# Patient Record
Sex: Female | Born: 1970 | Race: Black or African American | Hispanic: No | Marital: Single | Smoking: Never smoker
Health system: Southern US, Community
[De-identification: ages and names within clinical notes are randomized; demographics above are authoritative.]

## PROBLEM LIST (undated history)

## (undated) DIAGNOSIS — E785 Hyperlipidemia, unspecified: Secondary | ICD-10-CM

## (undated) DIAGNOSIS — R42 Dizziness and giddiness: Secondary | ICD-10-CM

## (undated) DIAGNOSIS — D649 Anemia, unspecified: Secondary | ICD-10-CM

## (undated) DIAGNOSIS — F32A Depression, unspecified: Secondary | ICD-10-CM

## (undated) DIAGNOSIS — M779 Enthesopathy, unspecified: Secondary | ICD-10-CM

## (undated) DIAGNOSIS — N840 Polyp of corpus uteri: Secondary | ICD-10-CM

## (undated) DIAGNOSIS — R011 Cardiac murmur, unspecified: Secondary | ICD-10-CM

## (undated) DIAGNOSIS — K5090 Crohn's disease, unspecified, without complications: Secondary | ICD-10-CM

## (undated) DIAGNOSIS — N943 Premenstrual tension syndrome: Secondary | ICD-10-CM

## (undated) DIAGNOSIS — S86899A Other injury of other muscle(s) and tendon(s) at lower leg level, unspecified leg, initial encounter: Secondary | ICD-10-CM

## (undated) DIAGNOSIS — F419 Anxiety disorder, unspecified: Secondary | ICD-10-CM

## (undated) DIAGNOSIS — I340 Nonrheumatic mitral (valve) insufficiency: Secondary | ICD-10-CM

## (undated) DIAGNOSIS — K219 Gastro-esophageal reflux disease without esophagitis: Secondary | ICD-10-CM

## (undated) DIAGNOSIS — F329 Major depressive disorder, single episode, unspecified: Secondary | ICD-10-CM

## (undated) DIAGNOSIS — M5136 Other intervertebral disc degeneration, lumbar region: Secondary | ICD-10-CM

## (undated) DIAGNOSIS — L309 Dermatitis, unspecified: Secondary | ICD-10-CM

## (undated) DIAGNOSIS — M51369 Other intervertebral disc degeneration, lumbar region without mention of lumbar back pain or lower extremity pain: Secondary | ICD-10-CM

## (undated) DIAGNOSIS — T7840XA Allergy, unspecified, initial encounter: Secondary | ICD-10-CM

## (undated) HISTORY — DX: Dizziness and giddiness: R42

## (undated) HISTORY — DX: Other injury of other muscle(s) and tendon(s) at lower leg level, unspecified leg, initial encounter: S86.899A

## (undated) HISTORY — PX: COLONOSCOPY: SHX174

## (undated) HISTORY — PX: TONSILLECTOMY: SUR1361

## (undated) HISTORY — DX: Hyperlipidemia, unspecified: E78.5

## (undated) HISTORY — DX: Nonrheumatic mitral (valve) insufficiency: I34.0

## (undated) HISTORY — DX: Major depressive disorder, single episode, unspecified: F32.9

## (undated) HISTORY — DX: Allergy, unspecified, initial encounter: T78.40XA

## (undated) HISTORY — DX: Depression, unspecified: F32.A

## (undated) HISTORY — DX: Dermatitis, unspecified: L30.9

## (undated) HISTORY — DX: Premenstrual tension syndrome: N94.3

## (undated) HISTORY — DX: Anxiety disorder, unspecified: F41.9

## (undated) HISTORY — DX: Other intervertebral disc degeneration, lumbar region: M51.36

## (undated) HISTORY — DX: Anemia, unspecified: D64.9

## (undated) HISTORY — DX: Polyp of corpus uteri: N84.0

## (undated) HISTORY — DX: Crohn's disease, unspecified, without complications: K50.90

## (undated) HISTORY — DX: Enthesopathy, unspecified: M77.9

## (undated) HISTORY — DX: Gastro-esophageal reflux disease without esophagitis: K21.9

## (undated) HISTORY — PX: DILATION AND CURETTAGE OF UTERUS: SHX78

## (undated) HISTORY — DX: Cardiac murmur, unspecified: R01.1

## (undated) HISTORY — DX: Other intervertebral disc degeneration, lumbar region without mention of lumbar back pain or lower extremity pain: M51.369

---

## 2005-02-17 ENCOUNTER — Ambulatory Visit: Payer: Self-pay | Admitting: Internal Medicine

## 2005-07-07 ENCOUNTER — Ambulatory Visit: Payer: Self-pay | Admitting: Internal Medicine

## 2005-07-22 ENCOUNTER — Encounter (INDEPENDENT_AMBULATORY_CARE_PROVIDER_SITE_OTHER): Payer: Self-pay | Admitting: *Deleted

## 2005-07-22 ENCOUNTER — Ambulatory Visit: Payer: Self-pay | Admitting: Internal Medicine

## 2005-09-01 ENCOUNTER — Encounter: Admission: RE | Admit: 2005-09-01 | Discharge: 2005-09-01 | Payer: Self-pay | Admitting: Otolaryngology

## 2006-04-26 ENCOUNTER — Ambulatory Visit: Payer: Self-pay | Admitting: Internal Medicine

## 2006-08-16 IMAGING — RF DG ESOPHAGUS
8 of 12 series · 13 of 24 positions shown · non-contrast
Comparison: None.

CLINICAL DATA: Dysphagia, choking.
 BARIUM SWALLOW:
TECHNIQUE: Included in this exam are three per second spot images of the cervical esophagus in the AP and lateral projections and ingestion of a 13 mm barium tablet.

[Series 1: run · 2 of 6 slices shown (1 of 8)]
[im 1/6]
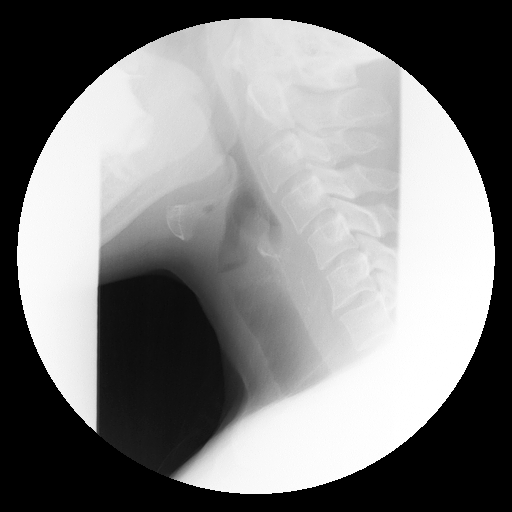
[im 4/6]
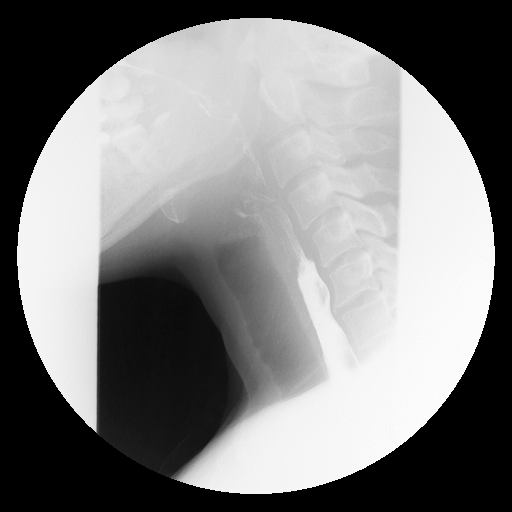

[Series 2: run · 3 of 8 slices shown (2 of 8)]
[im 1/8]
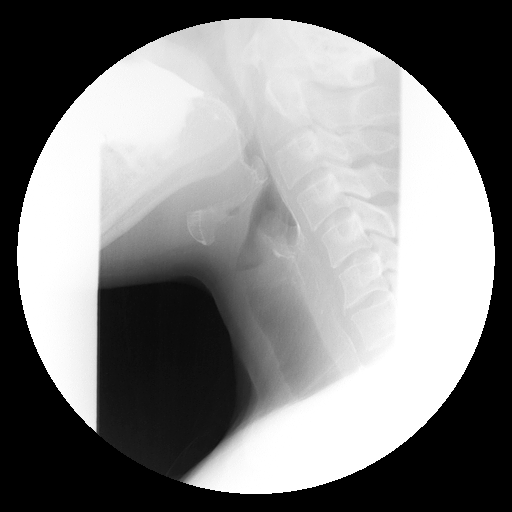
[im 4/8]
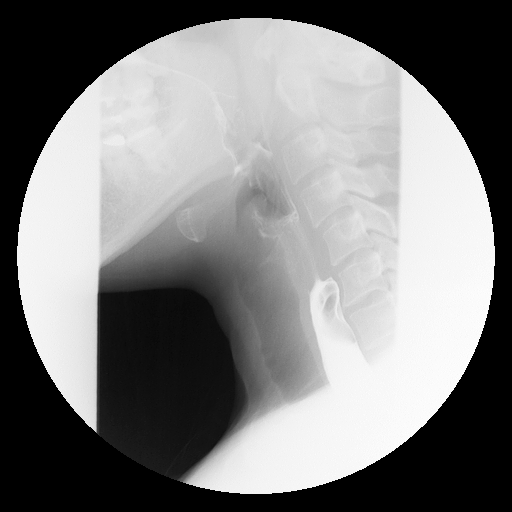
[im 8/8]
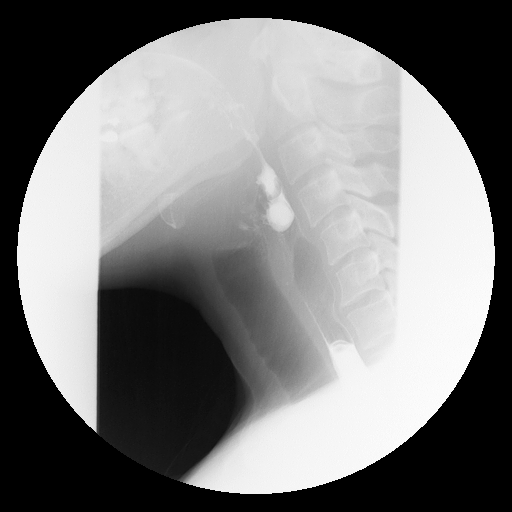

[Series 3: run · 3 of 7 slices shown (3 of 8)]
[im 2/7]
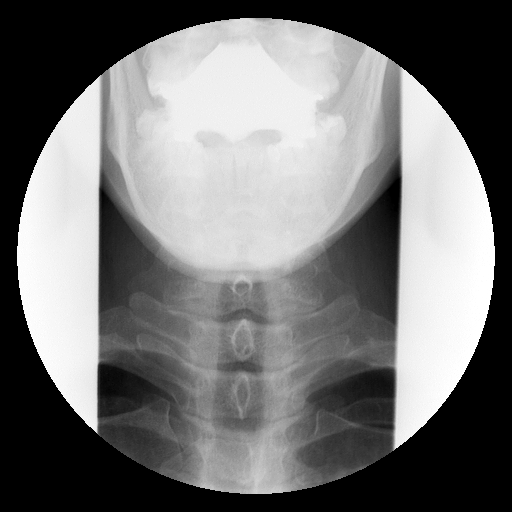
[im 4/7]
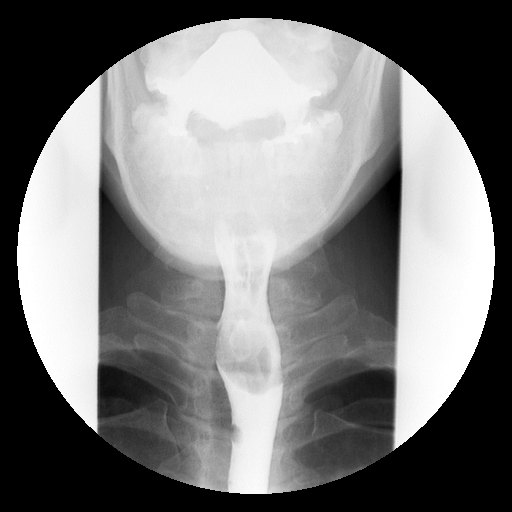
[im 5/7]
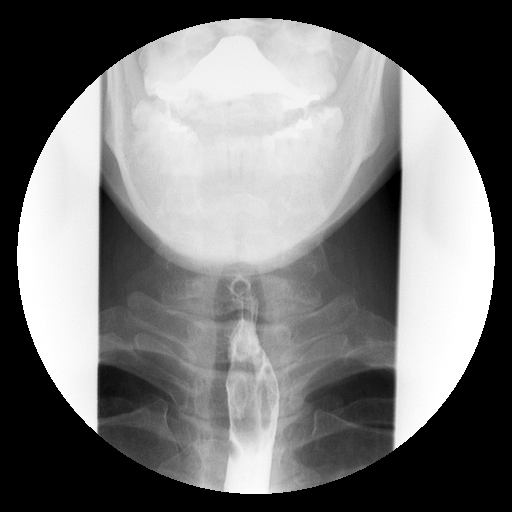

[Series 4: run · 1 of 1 slices shown (4 of 8)]
[im 1/1]
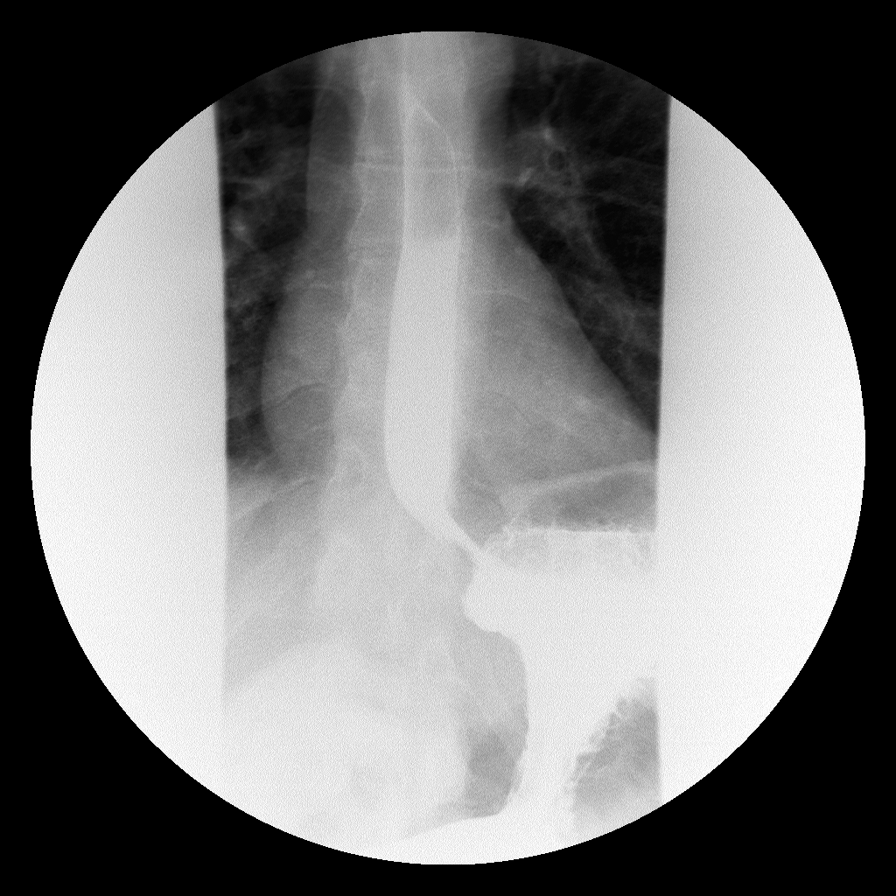

[Series 6: run · 1 of 1 slices shown (5 of 8)]
[im 1/1]
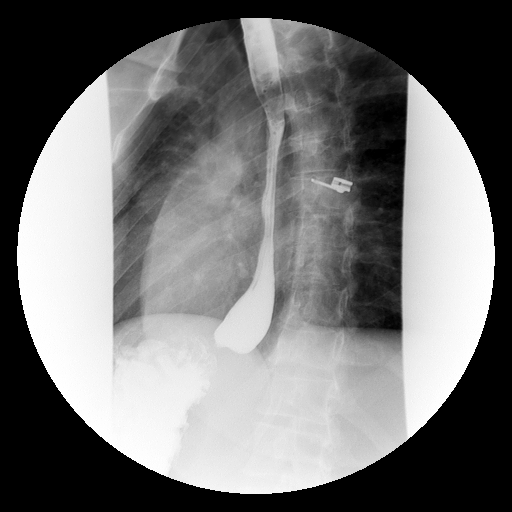

[Series 8: run · 1 of 1 slices shown (6 of 8)]
[im 1/1]
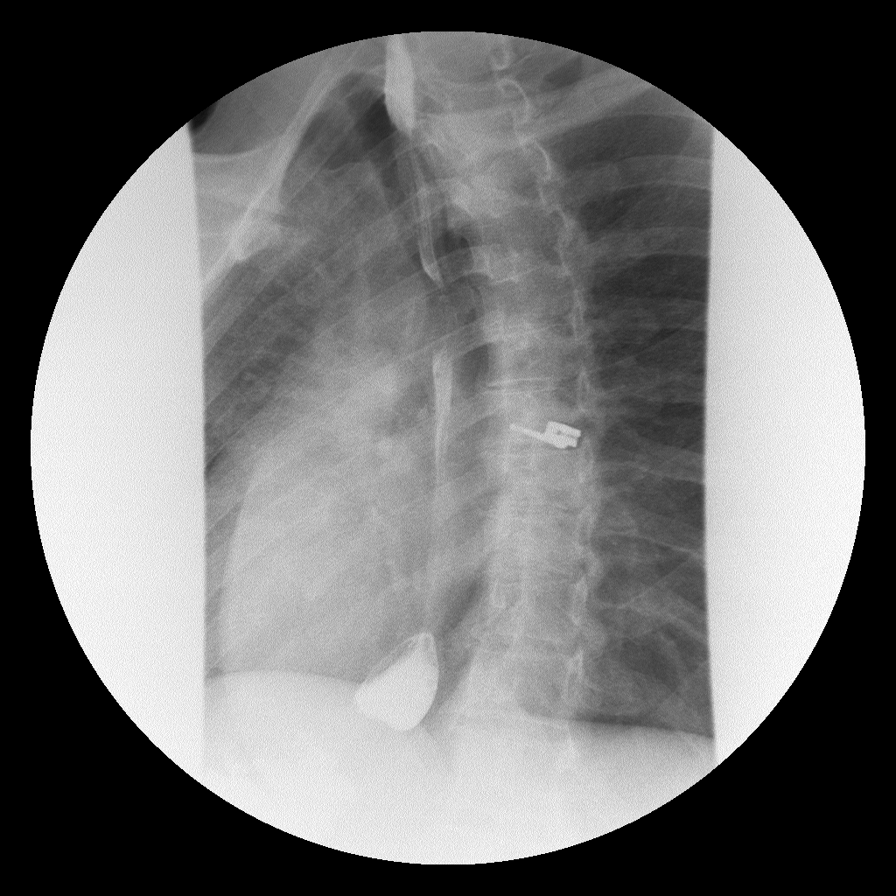

[Series 10: run · 1 of 1 slices shown (7 of 8)]
[im 1/1]
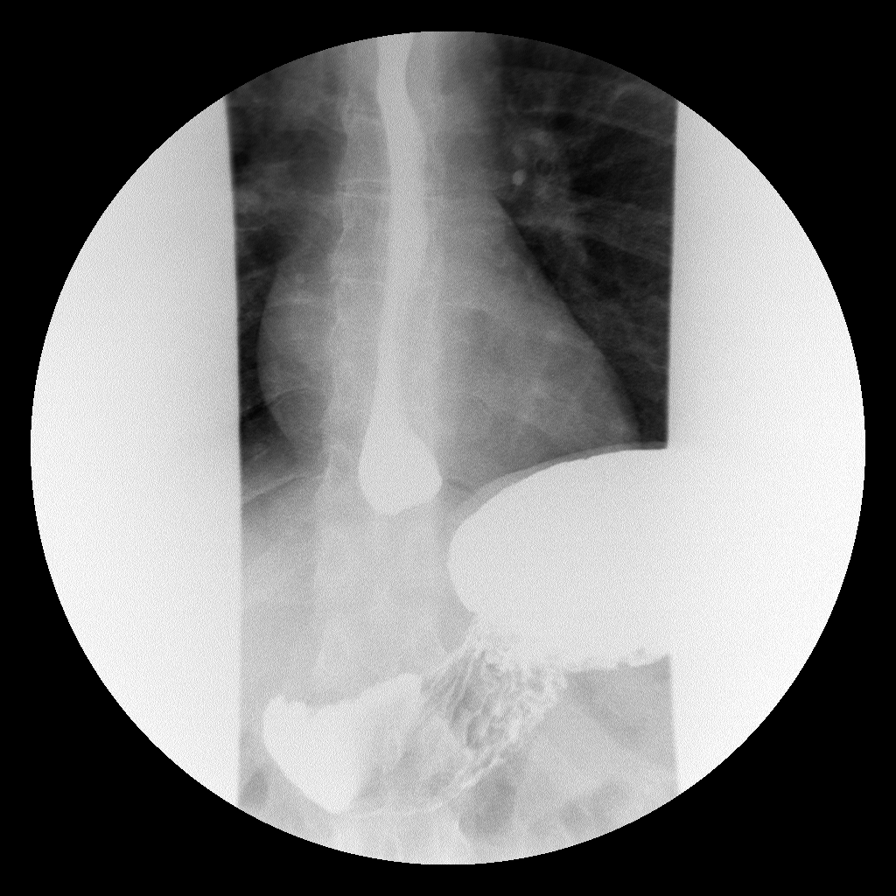

[Series 12: run · 1 of 1 slices shown (8 of 8)]
[im 1/1]
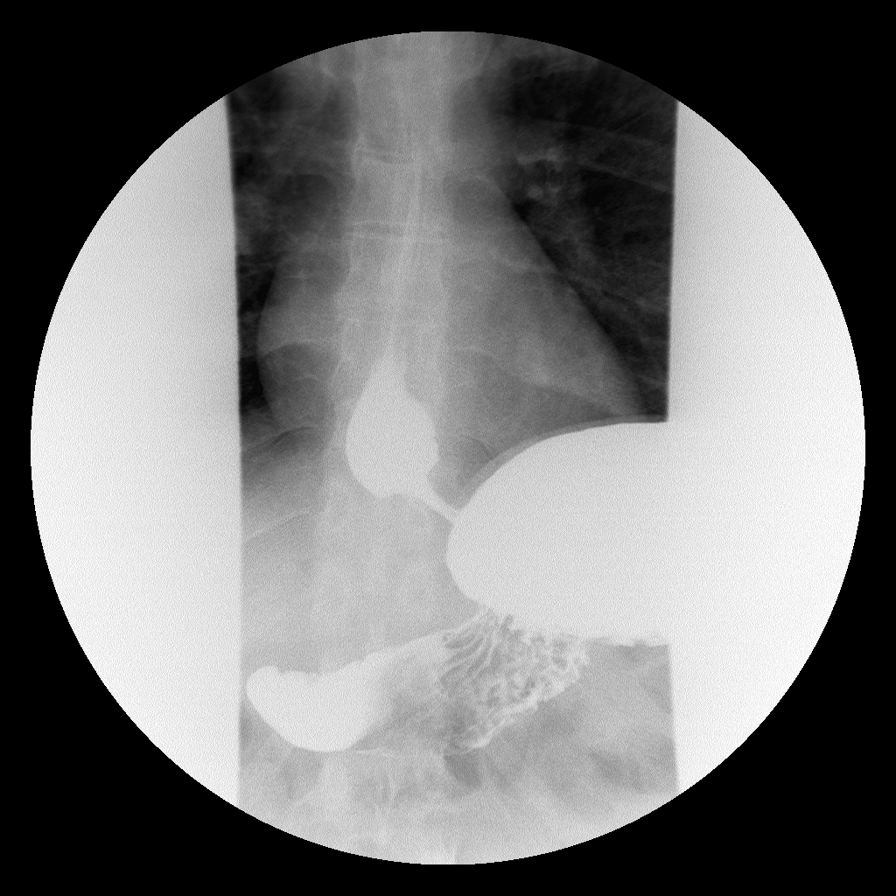

[13 of 24 positions shown; findings below may reference images not displayed]

FINDINGS: The swallowing mechanism is normal.  Three per second spot films in the lateral projection show no penetration of barium or aspiration.  No esophageal webs.  No cricopharyngeus defect.  No lesions of the esophagus demonstrated.  No hiatal hernia or reflux demonstrated with Valsalva or water siphon maneuvers.  The motility is also normal.
IMPRESSION: Normal exam.

## 2006-12-08 ENCOUNTER — Emergency Department (HOSPITAL_COMMUNITY): Admission: EM | Admit: 2006-12-08 | Discharge: 2006-12-08 | Payer: Self-pay | Admitting: Emergency Medicine

## 2006-12-13 ENCOUNTER — Ambulatory Visit: Payer: Self-pay | Admitting: Internal Medicine

## 2007-01-04 ENCOUNTER — Ambulatory Visit: Payer: Self-pay | Admitting: Internal Medicine

## 2007-04-03 ENCOUNTER — Encounter: Payer: Self-pay | Admitting: Internal Medicine

## 2007-04-03 LAB — CONVERTED CEMR LAB
ALT: 11 units/L (ref 0–35)
AST: 17 units/L (ref 0–37)
Albumin: 5.1 g/dL (ref 3.5–5.2)
Basophils Absolute: 0 10*3/uL (ref 0.0–0.1)
CO2: 21 meq/L (ref 19–32)
Eosinophils Absolute: 0 10*3/uL — ABNORMAL LOW (ref 0.2–0.7)
HCT: 39.6 % (ref 36.0–46.0)
Hemoglobin: 12.7 g/dL (ref 12.0–15.0)
Lymphocytes Relative: 18 % (ref 12–46)
Lymphs Abs: 1 10*3/uL (ref 0.7–4.0)
MCHC: 32.1 g/dL (ref 30.0–36.0)
Neutrophils Relative %: 75 % (ref 43–77)
Platelets: 271 10*3/uL (ref 150–400)
RBC: 4.27 M/uL (ref 3.87–5.11)
RDW: 16.5 % — ABNORMAL HIGH (ref 11.5–15.5)
WBC: 5.7 10*3/uL (ref 4.0–10.5)

## 2007-04-19 ENCOUNTER — Ambulatory Visit: Payer: Self-pay | Admitting: Internal Medicine

## 2007-05-07 DIAGNOSIS — K5289 Other specified noninfective gastroenteritis and colitis: Secondary | ICD-10-CM | POA: Insufficient documentation

## 2007-05-07 DIAGNOSIS — E7800 Pure hypercholesterolemia, unspecified: Secondary | ICD-10-CM | POA: Insufficient documentation

## 2007-05-07 DIAGNOSIS — K5010 Crohn's disease of large intestine without complications: Secondary | ICD-10-CM | POA: Insufficient documentation

## 2007-05-10 DIAGNOSIS — D649 Anemia, unspecified: Secondary | ICD-10-CM

## 2007-05-10 HISTORY — DX: Anemia, unspecified: D64.9

## 2007-07-06 ENCOUNTER — Ambulatory Visit: Payer: Self-pay | Admitting: Internal Medicine

## 2007-08-03 ENCOUNTER — Ambulatory Visit: Payer: Self-pay | Admitting: Internal Medicine

## 2007-08-03 ENCOUNTER — Encounter: Payer: Self-pay | Admitting: Internal Medicine

## 2007-10-10 ENCOUNTER — Telehealth: Payer: Self-pay | Admitting: Internal Medicine

## 2007-10-11 ENCOUNTER — Ambulatory Visit: Payer: Self-pay | Admitting: Internal Medicine

## 2007-10-12 LAB — CONVERTED CEMR LAB
ALT: 14 units/L (ref 0–35)
AST: 21 units/L (ref 0–37)
Albumin: 4.2 g/dL (ref 3.5–5.2)
Alkaline Phosphatase: 48 units/L (ref 39–117)
BUN: 5 mg/dL — ABNORMAL LOW (ref 6–23)
Basophils Absolute: 0.1 10*3/uL (ref 0.0–0.1)
CO2: 27 meq/L (ref 19–32)
Eosinophils Relative: 1.7 % (ref 0.0–5.0)
GFR calc Af Amer: 145 mL/min
GFR calc non Af Amer: 120 mL/min
Glucose, Bld: 70 mg/dL (ref 70–99)
HCT: 32.8 % — ABNORMAL LOW (ref 36.0–46.0)
Lymphocytes Relative: 31.6 % (ref 12.0–46.0)
MCHC: 34.3 g/dL (ref 30.0–36.0)
MCV: 89.4 fL (ref 78.0–100.0)
Monocytes Relative: 10.6 % (ref 3.0–12.0)
RBC: 3.67 M/uL — ABNORMAL LOW (ref 3.87–5.11)
RDW: 12.4 % (ref 11.5–14.6)
Total Bilirubin: 0.8 mg/dL (ref 0.3–1.2)
Total Protein: 7.5 g/dL (ref 6.0–8.3)
Transferrin: 285.2 mg/dL (ref >=212.0)

## 2007-11-06 ENCOUNTER — Telehealth: Payer: Self-pay | Admitting: Internal Medicine

## 2007-11-20 ENCOUNTER — Ambulatory Visit: Payer: Self-pay | Admitting: Internal Medicine

## 2008-01-22 ENCOUNTER — Telehealth: Payer: Self-pay | Admitting: Internal Medicine

## 2008-01-24 ENCOUNTER — Ambulatory Visit: Payer: Self-pay | Admitting: Internal Medicine

## 2008-01-24 LAB — CONVERTED CEMR LAB
Basophils Absolute: 0 10*3/uL (ref 0.0–0.1)
Basophils Absolute: 0 10*3/uL (ref 0.0–0.1)
Basophils Relative: 0.9 % (ref 0.0–1.0)
Eosinophils Absolute: 0 10*3/uL (ref 0.0–0.7)
Eosinophils Relative: 1.3 % (ref 0.0–5.0)
HCT: 39.2 % (ref 36.0–46.0)
HCT: 36.9 % (ref 36.0–46.0)
Hemoglobin: 13.6 g/dL (ref 12.0–15.0)
Hemoglobin: 12.4 g/dL (ref 12.0–15.0)
Lymphocytes Relative: 29.7 % (ref 12.0–46.0)
MCHC: 33.7 g/dL (ref 30.0–36.0)
Monocytes Relative: 12.1 % — ABNORMAL HIGH (ref 3.0–12.0)
Monocytes Relative: 12.9 % — ABNORMAL HIGH (ref 3.0–12.0)
Neutro Abs: 2.1 10*3/uL (ref 1.4–7.7)
Neutro Abs: 2 10*3/uL (ref 1.4–7.7)
Neutrophils Relative %: 55.7 % (ref 43.0–77.0)
Platelets: 185 10*3/uL (ref 150–400)
RBC: 4.32 M/uL (ref 3.87–5.11)
RDW: 13 % (ref 11.5–14.6)
WBC: 3.5 10*3/uL — ABNORMAL LOW (ref 4.5–10.5)

## 2008-06-05 ENCOUNTER — Ambulatory Visit: Payer: Self-pay | Admitting: Internal Medicine

## 2008-06-05 DIAGNOSIS — L259 Unspecified contact dermatitis, unspecified cause: Secondary | ICD-10-CM | POA: Insufficient documentation

## 2008-06-05 DIAGNOSIS — J309 Allergic rhinitis, unspecified: Secondary | ICD-10-CM | POA: Insufficient documentation

## 2008-06-05 DIAGNOSIS — K219 Gastro-esophageal reflux disease without esophagitis: Secondary | ICD-10-CM | POA: Insufficient documentation

## 2008-06-05 DIAGNOSIS — N943 Premenstrual tension syndrome: Secondary | ICD-10-CM | POA: Insufficient documentation

## 2008-06-05 DIAGNOSIS — R599 Enlarged lymph nodes, unspecified: Secondary | ICD-10-CM | POA: Insufficient documentation

## 2008-06-05 LAB — CONVERTED CEMR LAB
ALT: 17 units/L (ref 0–35)
AST: 28 units/L (ref 0–37)
Albumin: 4.3 g/dL (ref 3.5–5.2)
BUN: 8 mg/dL (ref 6–23)
Creatinine, Ser: 0.7 mg/dL (ref 0.4–1.2)
Eosinophils Absolute: 0.1 10*3/uL (ref 0.0–0.7)
GFR calc Af Amer: 120 mL/min
Glucose, Bld: 64 mg/dL — ABNORMAL LOW (ref 70–99)
Monocytes Absolute: 0.3 10*3/uL (ref 0.1–1.0)
Monocytes Relative: 7.6 % (ref 3.0–12.0)
Neutro Abs: 2 10*3/uL (ref 1.4–7.7)
Saturation Ratios: 19.9 % — ABNORMAL LOW (ref 20.0–50.0)
Sed Rate: 12 mm/hr (ref 0–22)
Total Bilirubin: 0.8 mg/dL (ref 0.3–1.2)
Total Protein: 7.4 g/dL (ref 6.0–8.3)
Transferrin: 280.1 mg/dL (ref >=212.0)
Vitamin B-12: >1500 pg/mL — ABNORMAL HIGH (ref 211–911)
WBC: 3.7 10*3/uL — ABNORMAL LOW (ref 4.5–10.5)

## 2008-11-20 ENCOUNTER — Ambulatory Visit: Payer: Self-pay | Admitting: Internal Medicine

## 2008-11-28 ENCOUNTER — Encounter: Payer: Self-pay | Admitting: Internal Medicine

## 2008-11-28 LAB — CONVERTED CEMR LAB
Albumin: 4.5 g/dL (ref 3.5–5.2)
Alkaline Phosphatase: 44 units/L (ref 39–117)
BUN: 6 mg/dL (ref 6–23)
Basophils Absolute: 0 10*3/uL (ref 0.0–0.1)
CO2: 24 meq/L (ref 19–32)
Calcium: 9.4 mg/dL (ref 8.4–10.5)
Creatinine, Ser: 0.71 mg/dL (ref 0.40–1.20)
Eosinophils Absolute: 0.1 10*3/uL (ref 0.0–0.7)
Glucose, Bld: 69 mg/dL — ABNORMAL LOW (ref 70–99)
HCT: 40 % (ref 36.0–46.0)
Hemoglobin: 13.1 g/dL (ref 12.0–15.0)
Iron: 76 ug/dL (ref 42–145)
MCHC: 32.8 g/dL (ref 30.0–36.0)
Neutro Abs: 1.7 10*3/uL (ref 1.7–7.7)
Potassium: 3.7 meq/L (ref 3.5–5.3)
RDW: 14.7 % (ref 11.5–15.5)
Sed Rate: 8 mm/hr (ref 0–22)
Sodium: 141 meq/L (ref 135–145)
Total Protein: 7.3 g/dL (ref 6.0–8.3)
UIBC: 275 ug/dL

## 2009-02-13 ENCOUNTER — Telehealth: Payer: Self-pay | Admitting: Internal Medicine

## 2009-02-18 ENCOUNTER — Encounter: Payer: Self-pay | Admitting: Internal Medicine

## 2009-02-18 LAB — CONVERTED CEMR LAB
AST: 17 units/L (ref 0–37)
Albumin: 5.1 g/dL (ref 3.5–5.2)
Basophils Absolute: 0 10*3/uL (ref 0.0–0.1)
Chloride: 102 meq/L (ref 96–112)
Creatinine, Ser: 0.78 mg/dL (ref 0.40–1.20)
Glucose, Bld: 63 mg/dL — ABNORMAL LOW (ref 70–99)
HCT: 41.1 % (ref 36.0–46.0)
Helicobacter Pylori Antibody-IgG: <0.4
Hemoglobin: 13.7 g/dL (ref 12.0–15.0)
Iron: 65 ug/dL (ref 42–145)
Lymphocytes Relative: 32 % (ref 12–46)
Monocytes Absolute: 0.3 10*3/uL (ref 0.1–1.0)
Neutro Abs: 2.1 10*3/uL (ref 1.7–7.7)
Platelets: 201 10*3/uL (ref 150–400)
Potassium: 3.9 meq/L (ref 3.5–5.3)
RDW: 13.7 % (ref 11.5–15.5)
Total Bilirubin: 0.5 mg/dL (ref 0.3–1.2)
Total Protein: 8.2 g/dL (ref 6.0–8.3)
UIBC: 332 ug/dL
Vitamin B-12: 1783 pg/mL — ABNORMAL HIGH (ref 211–911)

## 2009-02-20 ENCOUNTER — Ambulatory Visit: Payer: Self-pay | Admitting: Internal Medicine

## 2009-05-19 ENCOUNTER — Encounter: Payer: Self-pay | Admitting: Internal Medicine

## 2009-05-20 LAB — CONVERTED CEMR LAB
AST: 20 units/L (ref 0–37)
BUN: 8 mg/dL (ref 6–23)
CO2: 20 meq/L (ref 19–32)
Hemoglobin: 13 g/dL (ref 12.0–15.0)
MCV: 89.3 fL (ref 78.0–100.0)
Platelets: 207 10*3/uL (ref 150–400)
RBC: 4.39 M/uL (ref 3.87–5.11)
Saturation Ratios: 22 % (ref 20–55)
WBC: 4.3 10*3/uL (ref 4.0–10.5)

## 2009-05-25 ENCOUNTER — Telehealth: Payer: Self-pay | Admitting: Internal Medicine

## 2009-07-06 ENCOUNTER — Encounter: Payer: Self-pay | Admitting: Internal Medicine

## 2009-08-05 ENCOUNTER — Telehealth: Payer: Self-pay | Admitting: Internal Medicine

## 2009-09-10 ENCOUNTER — Ambulatory Visit: Payer: Self-pay | Admitting: Internal Medicine

## 2009-09-11 ENCOUNTER — Encounter: Payer: Self-pay | Admitting: Internal Medicine

## 2009-09-11 ENCOUNTER — Telehealth: Payer: Self-pay | Admitting: Internal Medicine

## 2009-09-20 ENCOUNTER — Encounter: Payer: Self-pay | Admitting: Internal Medicine

## 2009-09-21 ENCOUNTER — Encounter: Payer: Self-pay | Admitting: Internal Medicine

## 2009-10-09 ENCOUNTER — Encounter: Payer: Self-pay | Admitting: Internal Medicine

## 2009-10-09 LAB — CONVERTED CEMR LAB
Albumin: 4.5 g/dL (ref 3.5–5.2)
Alkaline Phosphatase: 45 units/L (ref 39–117)
Basophils Relative: 1 % (ref 0–1)
Calcium: 9.1 mg/dL (ref 8.4–10.5)
Chloride: 103 meq/L (ref 96–112)
Creatinine, Ser: 0.7 mg/dL (ref 0.40–1.20)
HCT: 40.8 % (ref 36.0–46.0)
Iron: 78 ug/dL (ref 42–145)
Lymphocytes Relative: 28 % (ref 12–46)
MCV: 90.9 fL (ref 78.0–100.0)
Neutro Abs: 2.3 10*3/uL (ref 1.7–7.7)
Neutrophils Relative %: 60 % (ref 43–77)
Potassium: 3.9 meq/L (ref 3.5–5.3)
Saturation Ratios: 21 % (ref 20–55)
TIBC: 366 ug/dL (ref 250–470)
Total Bilirubin: 0.4 mg/dL (ref 0.3–1.2)
Total Protein: 7.3 g/dL (ref 6.0–8.3)
UIBC: 288 ug/dL
WBC: 3.8 10*3/uL — ABNORMAL LOW (ref 4.0–10.5)

## 2009-10-23 ENCOUNTER — Ambulatory Visit: Payer: Self-pay | Admitting: Internal Medicine

## 2009-10-29 ENCOUNTER — Encounter: Payer: Self-pay | Admitting: Internal Medicine

## 2009-11-02 ENCOUNTER — Telehealth: Payer: Self-pay | Admitting: Internal Medicine

## 2010-01-25 ENCOUNTER — Telehealth: Payer: Self-pay | Admitting: Internal Medicine

## 2010-01-27 ENCOUNTER — Encounter: Payer: Self-pay | Admitting: Internal Medicine

## 2010-02-01 ENCOUNTER — Telehealth: Payer: Self-pay | Admitting: Internal Medicine

## 2010-02-18 ENCOUNTER — Encounter: Payer: Self-pay | Admitting: Internal Medicine

## 2010-02-22 LAB — CONVERTED CEMR LAB
Basophils Absolute: 0 10*3/uL (ref 0.0–0.1)
Basophils Relative: 1 % (ref 0–1)
Eosinophils Absolute: 0.1 10*3/uL (ref 0.0–0.7)
Eosinophils Relative: 2 % (ref 0–5)
Ferritin: 19 ng/mL (ref 10–291)
Folate: >20 ng/mL
HCT: 40.7 % (ref 36.0–46.0)
Hemoglobin: 13.4 g/dL (ref 12.0–15.0)
Iron: 93 ug/dL (ref 42–145)
Lymphocytes Relative: 31 % (ref 12–46)
Lymphs Abs: 1.2 10*3/uL (ref 0.7–4.0)
MCHC: 32.9 g/dL (ref 30.0–36.0)
MCV: 88.7 fL (ref 78.0–100.0)
Monocytes Absolute: 0.3 10*3/uL (ref 0.1–1.0)
Monocytes Relative: 7 % (ref 3–12)
Neutro Abs: 2.3 10*3/uL (ref 1.7–7.7)
Neutrophils Relative %: 60 % (ref 43–77)
Platelets: 209 10*3/uL (ref 150–400)
RBC: 4.59 M/uL (ref 3.87–5.11)
RDW: 14 % (ref 11.5–15.5)
Saturation Ratios: 25 % (ref 20–55)
Sed Rate: 10 mm/hr (ref 0–22)
TIBC: 379 ug/dL (ref 250–470)
UIBC: 286 ug/dL
Vitamin B-12: 810 pg/mL (ref 211–911)
WBC: 3.8 10*3/uL — ABNORMAL LOW (ref 4.0–10.5)

## 2010-05-18 ENCOUNTER — Telehealth: Payer: Self-pay | Admitting: Internal Medicine

## 2010-05-24 ENCOUNTER — Ambulatory Visit: Admit: 2010-05-24 | Payer: Self-pay | Admitting: Internal Medicine

## 2010-05-25 ENCOUNTER — Encounter: Payer: Self-pay | Admitting: Internal Medicine

## 2010-06-08 NOTE — Letter (Signed)
Summary: [facility] Instructions  [facility]  [place], [place] [address]   Phone: [phone]  Fax: [phone]       Meagan Hurley    1970-11-30    MRN: [id number]       Procedure Day /Date: Friday 10/23/09     Arrival Time: 12:30 pm     Procedure Time: 1:30 pm     Location of Procedure:                    _x _  [facility] (4th Floor)  [place]  Starting 5 days prior to your procedure (10/18/09) do not eat nuts, seeds, popcorn, corn, beans, peas,  salads, or any raw vegetables.  Do not take any fiber supplements (e.g. Metamucil, Citrucel, and Benefiber). ____________________________________________________________________________________________________   THE DAY BEFORE YOUR PROCEDURE         DATE: 10/22/09 DAY: Thursday  1   Drink clear liquids the entire day-NO SOLID FOOD  2   Do not drink anything colored red or purple.  Avoid juices with pulp.  No orange juice.  3   Drink at least 64 oz. (8 glasses) of fluid/clear liquids during the day to prevent dehydration and help the prep work efficiently.  CLEAR LIQUIDS INCLUDE: Water Jello Ice Popsicles Tea (sugar ok, no milk/cream) Powdered fruit flavored drinks Coffee (sugar ok, no milk/cream) Gatorade Juice: apple, white grape, white cranberry  Lemonade Clear bullion, consomm, broth Carbonated beverages (any kind) Strained chicken noodle soup Hard Candy  4   Mix the entire bottle of Miralax with 64 oz. of Gatorade/Powerade in the morning and put in the refrigerator to chill.  5   At 3:00 pm take 2 Dulcolax/Bisacodyl tablets.  6   At 4:30 pm take one Reglan/Metoclopramide tablet.  7  Starting at 5:00 pm drink one 8 oz glass of the Miralax mixture every 15-20 minutes until you have finished drinking the entire 64 oz.  You should finish drinking prep around 7:30 or 8:00 pm.  8   If you are nauseated, you may take the 2nd Reglan/Metoclopramide  tablet at 6:30 pm.        9    At 8:00 pm take 2 more DULCOLAX/Bisacodyl tablets.        THE DAY OF YOUR PROCEDURE      DATE:  10/23/09 DAY: Friday  You may drink clear liquids until 11:30 am  (2 HOURS BEFORE PROCEDURE).   MEDICATION INSTRUCTIONS  Unless otherwise instructed, you should take regular prescription medications with a small sip of water as early as possible the morning of your procedure.        OTHER INSTRUCTIONS  You will need a responsible adult at least 41 years of age to accompany you and drive you home.   This person must remain in the waiting room during your procedure.  Wear loose fitting clothing that is easily removed.  Leave jewelry and other valuables at home.  However, you may wish to bring a book to read or an iPod/MP3 player to listen to music as you wait for your procedure to start.  Remove all body piercing jewelry and leave at home.  Total time from sign-in until discharge is approximately 2-3 hours.  You should go home directly after your procedure and rest.  You can resume normal activities the day after your procedure.  The day of your procedure you should not:   Drive   Make  legal decisions   Operate machinery   Drink alcohol   Return to work  You will receive specific instructions about eating, activities and medications before you leave.   The above instructions have been reviewed and explained to me by   _______________________    I fully understand and can verbalize these instructions _____________________________ Date 09/11/09

## 2010-06-08 NOTE — Medication Information (Signed)
Summary: Approved/[employer]  Approved/[employer]   Imported By: Bubba Hales 02/03/2010 11:33:47  _____________________________________________________________________  External Attachment:    Type:   Image     Comment:   External Document

## 2010-06-08 NOTE — Progress Notes (Signed)
Summary: needs rx sent to her pharmacy  Medications Added NEXIUM 40 MG CPDR (ESOMEPRAZOLE MAGNESIUM) Take 1 tablet by mouth once a day (pharmacy-please d/c prescription for pantoprazole) ANALPRAM-HC 1-2.5 % CREA (HYDROCORTISONE ACE-PRAMOXINE) Apply to rectum two to three times a day as needed       Phone Note Call from Patient Call back at Home Phone [phone]   Caller: Patient Call For: Dr Olevia Perches Reason for Call: Talk to Nurse Summary of Call: Patient would like Nexium called in to her pharmacy fro a 30 days supply ([facility] [place]) Patient wants to know if she can get an otc med for anal tearing or can she get something prescribed and sent to the same pharmacy. 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 Dottie Nelson-Smith CMA Deborra Medina)  January 25, 2010 1:47 PM   Additional Follow-up for Phone Call Additional follow up Details #1::        Patient called back to let us know that she has been trying Nexium and that it has been more effective for her. Therefore, she would like to switch from Protonix to Nexium. I will send her an prescription and also put some samples at front desk for her to pick up when she can.  Additional Follow-up by: Madlyn Frankel CMA Deborra Medina),  January 26, 2010 12:52 PM    New/Updated Medications: NEXIUM 40 MG CPDR (ESOMEPRAZOLE MAGNESIUM) Take 1 tablet by mouth once a day (pharmacy-please d/c prescription for pantoprazole) ANALPRAM-HC 1-2.5 % CREA (HYDROCORTISONE ACE-PRAMOXINE) Apply to rectum two to three times a day as needed Prescriptions: NEXIUM 40 MG CPDR (ESOMEPRAZOLE MAGNESIUM) Take 1 tablet by mouth once a day (pharmacy-please d/c prescription for pantoprazole)  #30 x 2   Entered by:   Madlyn Frankel CMA (AAMA)   Authorized by:   Lafayette Dragon MD   Signed by:   Madlyn Frankel CMA (AAMA) on 01/26/2010   Method used:   Electronically to        [facility] [phone]* (retail)       [address]       [place], [place]  [address]       Ph: [phone]       Fax: [phone]   RxID:   [id number] ANALPRAM-HC 1-2.5 % CREA (HYDROCORTISONE ACE-PRAMOXINE) Apply to rectum two to three times a day as needed  #30 grams x 1   Entered by:   Madlyn Frankel CMA ([place])   Authorized by:   Lafayette Dragon MD   Signed by:   Madlyn Frankel CMA ([place]) on 01/25/2010   Method used:   Electronically to        [facility] [phone]* (retail)       [address]       [place], [place]  [address]       Ph: [phone]       Fax: [phone]   RxID:   [id number]

## 2010-06-08 NOTE — Letter (Signed)
Summary: Patient Notice- Colon Biospy Results  [facility]  [address] [place], [place] [address]   Phone: [phone]  Fax: [phone]        October 29, 2009 MRN: [id number]    [facility] [place], [place]  [address]    Dear Meagan Hurley,  I am pleased to inform you that the biopsies taken during your recent colonoscopy did not show any evidence of cancer upon pathologic examination.The biopsies from Your terminal ileum, right colon and the left colon show normal mucosa. 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  Additional information/recommendations:  __No further action is needed at this time.  Please follow-up with      your primary care physician for your other healthcare needs.  __Please call [phone] to schedule a return visit to review      your condition.  _x_Continue with the treatment plan as outlined on the day of your      exam.The enemas and the suppositories should help to control the rectal disease.  _x_You should have a repeat colonoscopy examination for this problem           in 5_ years.  Please call us if you are having persistent problems or have questions about your condition that have not been fully answered at this time.  Sincerely,  Lafayette Dragon MD   This letter has been electronically signed by your physician.  Appended Document: Patient Notice- Colon Biospy Results letter mailed.

## 2010-06-08 NOTE — Progress Notes (Signed)
Summary: Miralax   Phone Note Call from Patient Call back at Home Phone [phone]   Caller: Patient Call For: Dr. Olevia Perches Reason for Call: Talk to Nurse Summary of Call: Pt. needs her script for Miralax faxed to her # [phone] Attn: Dr. Ishmael Holter...procedure is sch'd for 10-23-09 Initial call taken by: Webb Laws,  Sep 11, 2009 12:58 PM  Follow-up for Phone Call        I have sent miralax prep instructions to [facility] office for her to fill out and fax back to our office. I have also sent prescription's for miralax prep.  Follow-up by: Madlyn Frankel CMA Deborra Medina),  Sep 11, 2009 2:27 PM

## 2010-06-08 NOTE — Medication Information (Signed)
Summary: Rx Request for Pantoprazole/Patient  Rx Request for Pantoprazole/Patient   Imported By: Phillis Knack 09/23/2009 12:15:49  _____________________________________________________________________  External Attachment:    Type:   Image     Comment:   External Document

## 2010-06-08 NOTE — Miscellaneous (Signed)
Summary: Nexium Approved/[employer]   Case ID: [id number] Member Number: [id number] Case Type: Initial Review Case Start Date: 01/27/2010 Case Status: Coverage has been APPROVED. You will receive a confirmation letter confirming approval of this medication. The patient will also be notified of this approval via an automated outbound phone call or a letter. Please allow approximately 2 hours to update our system with the approval. Once updated, the prescription can be re-submitted.   Coverage Start Date: 01/06/2010 Coverage End Date: 01/27/2011  Patient First Name: Meagan Patient Last Name: Hurley DOB: 1971/03/02 Patient Street Address: [place]: [place] Patient State: [place] Patient Zip: [phone]  Drug Name & Strength: Nexium 40 Mg

## 2010-06-08 NOTE — Miscellaneous (Signed)
Summary: Pantoprazole Rx  Clinical Lists Changes  Medications: Changed medication from PROTONIX 40 MG TBEC (PANTOPRAZOLE SODIUM) Take 1 tablet by mouth once a day to PANTOPRAZOLE SODIUM 40 MG TBEC (PANTOPRAZOLE SODIUM) Take 1 tablet by mouth two times a day PLEASE GIVE GENERIC ONLY!!! - Signed Rx of PANTOPRAZOLE SODIUM 40 MG TBEC (PANTOPRAZOLE SODIUM) Take 1 tablet by mouth two times a day PLEASE GIVE GENERIC ONLY!!!;  #180 x 3;  Signed;  Entered by: Madlyn Frankel CMA (AAMA);  Authorized by: Lafayette Dragon MD;  Method used: Electronically to [facility]*, , ,   , Ph: [phone], Fax: [phone]    Prescriptions: PANTOPRAZOLE SODIUM 40 MG TBEC (PANTOPRAZOLE SODIUM) Take 1 tablet by mouth two times a day PLEASE GIVE GENERIC ONLY!!!  #180 x 3   Entered by:   Madlyn Frankel CMA ([place])   Authorized by:   Lafayette Dragon MD   Signed by:   [place] ([place]) on 09/21/2009   Method used:   Electronically to        [facility] (mail-order)             ,          Ph: [phone]       Fax: [phone]   RxID:   [id number]

## 2010-06-08 NOTE — Progress Notes (Signed)
Summary: Condition update   Phone Note Call from Patient Call back at Home Phone [phone]   Caller: Patient Call For: Dr. Olevia Perches Reason for Call: Talk to Nurse Summary of Call: Condition update: Colitis is better her main complaint is the GERD. Missed communication regarding yesterday's appt. thought appt. was 09-04-09. r/s appt. until 09-10-09 Initial call taken by: Webb Laws,  August 05, 2009 12:07 PM  Follow-up for Phone Call        May increase Protonix to two times a day as needed to control the reflux Follow-up by: Lafayette Dragon MD,  August 06, 2009 8:23 AM  Additional Follow-up for Phone Call Additional follow up Details #1::        Message left for pt. with above MD instructions. Pt. instructed to call back as needed.  Additional Follow-up by: Vivia Ewing LPN,  August 06, 1945 8:24 AM

## 2010-06-08 NOTE — Progress Notes (Signed)
Summary: refill  Phone Note Call from Patient Call back at Home Phone [phone]   Caller: Patient Call For: Olevia Perches Summary of Call: Patient needs refills for Prednmisone [facility] Initial call taken by: Ronalee Red,  November 02, 2009 1:04 PM  Follow-up for Phone Call        Dr Olevia Perches-  What dosage of prednisone is patient supposed to be on? Follow-up by: Madlyn Frankel CMA Deborra Medina),  November 02, 2009 1:17 PM  Additional Follow-up for Phone Call Additional follow up Details #1::        I have left her a message to let me know if she wants the Cort-enema or Prednisone and to clarify why she needs it. Additional Follow-up by: Lafayette Dragon MD,  November 02, 2009 6:29 PM    Additional Follow-up for Phone Call Additional follow up Details #2::    I have left a message for the patient to call back.Dottie Nelson-Smith CMA Deborra Medina)  November 04, 2009 8:39 AM  Pt returned call.  She states that she would like prednisone in whatever dose, duration and quantity.  Also how she should take the prednisone when she has a flare...daily or taper.  She states that she does not need a call back, but if one is necessary the home number would be the best option. Abelino Derrick CMA Deborra Medina)  November 04, 2009 1:31 PM   Additional Follow-up for Phone Call Additional follow up Details #3:: Details for Additional Follow-up Action Taken: Pt returned call after listening to your Voicemail and would like Prednisone 5 mg. Webb Laws,  November 04, 2009 1:33 PM  Patient wants prednisone to keep on hand in case of colitis flare. I have sent Prednisone 80m per her request. Dottie Nelson-Smith CMA (AAMA)  November 04, 2009 1:51 PM   New/Updated Medications: PREDNISONE 5 MG TABS (PREDNISONE) Take as directed. Prescriptions: PREDNISONE 5 MG TABS (PREDNISONE) Take as directed.  #100 x 0   Entered by:   DMadlyn FrankelCMA ([place]   Authorized by:   DLafayette DragonMD   Signed by:   DMadlyn Frankel CMA ([place] on 11/04/2009   Method used:   Electronically to        [facility]#[phone] (retail)       [address]      [place]  [address]      Ph: [phone]      Fax: [phone]  RxID:   [id number]  Appended Document: refill I approve pt having Prednisone at home for Crohn's flare up.

## 2010-06-08 NOTE — Progress Notes (Signed)
Summary: Medication Refill   Phone Note Call from Patient Call back at Home Phone [phone]   Caller: Patient Call For: Dr. Olevia Perches Reason for Call: Refill Medication Summary of Call: Pt needs her Miralax sent to [facility] Initial call taken by: Webb Laws,  May 25, 2009 12:12 PM  Follow-up for Phone Call        rx sent. patient aware Follow-up by: Bernita Buffy CMA Deborra Medina),  May 25, 2009 3:28 PM    Prescriptions: MIRALAX  POWD (POLYETHYLENE GLYCOL 3350) 1 capful dissolved in water once daily as needed  #527 x 3   Entered by:   Bernita Buffy CMA ([place])   Authorized by:   Lafayette Dragon MD   Signed by:   Bernita Buffy CMA ([place]) on 05/25/2009   Method used:   Electronically to        [facility] (mail-order)             ,          Ph: [phone]       Fax: [phone]   RxID:   [id number]

## 2010-06-08 NOTE — Assessment & Plan Note (Signed)
Summary: f/u GERD--ch.    History of Present Illness Visit Type: Follow-up Visit Primary GI MD: Delfin Edis MD Primary Provider: Heath Gold, MD Requesting Provider: n/a Chief Complaint: follow-up gerd  better on Protonix bid and follow-up colitis History of Present Illness:   This is a 40 year old [place] American female with Crohn's disease which is currently in remission. She is here to discuss a follow up screening colonoscopy. She denies any diarrhea, abdominal pain or rectal bleeding. She has had chronic constipation which is relieved by MiraLax 17 g daily in addition to pear juice and prunes. She also takes 2 fiber tablets daily. Her gastroesophageal reflux has been under control with Protonix 40 mg twice a day. Her weight has been stable at 174 pounds.   GI Review of Systems      Denies abdominal pain, acid reflux, belching, bloating, chest pain, dysphagia with liquids, dysphagia with solids, heartburn, loss of appetite, nausea, vomiting, vomiting blood, weight loss, and  weight gain.        Denies anal fissure, black tarry stools, change in bowel habit, constipation, diarrhea, diverticulosis, fecal incontinence, heme positive stool, hemorrhoids, irritable bowel syndrome, jaundice, light color stool, liver problems, rectal bleeding, and  rectal pain.    Current Medications (verified): 1)  Mercaptopurine 50 Mg Tabs (Mercaptopurine) .... Take 1 Tablet By Mouth Once A Day 2)  Colazal 750 Mg Caps (Balsalazide Disodium) .... Take 3 Tablets By Mouth Three Times A Day 3)  Miralax  Powd (Polyethylene Glycol 3350) .Marland Kitchen.. 1 Capful Dissolved in Water Once Daily As Needed 4)  Canasa 1000 Mg Supp (Mesalamine) .... Insert One Per Rectum At Bedtime 5)  Rowasa 4 Gm Kit (Mesalamine-Cleanser) .... At Bedtime When Symptoms Flare 6)  Tylenol Extra Strength 500 Mg Tabs (Acetaminophen) .... As Needed 7)  Nasonex 50 Mcg/act Susp (Mometasone Furoate) .... One Spray Every Morning As Needed 8)   Multivitamins  Tabs (Multiple Vitamin) .... Take 1 Tablet By Mouth Once A Day 9)  Calcium 500 +d 500-400 Mg-Unit Tabs (Calcium-Vitamin D) .... Take 1 Tablet By Mouth Once A Day 10)  Ferrous Sulfate 325 (65 Fe) Mg Tbec (Ferrous Sulfate) .... Take 1 Tablet By Mouth Once A Day 11)  Protonix 40 Mg Tbec (Pantoprazole Sodium) .... Take 1 Tablet By Mouth Once A Day 12)  Fluocinolone Acetonide 0.01 % Crea (Fluocinolone Acetonide) .... As Needed 13)  Fiber Supplement .... Take 2 Tablets By Mouth Once Daily 14)  Voltaren 1 % Gel (Diclofenac Sodium) .... Apply Two Times A Day 15)  Tums 500 Mg Chew (Calcium Carbonate Antacid) .... As Needed 16)  Rolaids 334 Mg Chew (Dihydroxyaluminum Sod Carb) .... As Needed 17)  Zoloft 25 Mg Tabs (Sertraline Hcl) .Marland Kitchen.. 1 By Mouth Once Daily 18)  Loratadine 10 Mg Tabs (Loratadine) .... Take As Needed 19)  Mometasone Furoate 0.1 % Crea (Mometasone Furoate) .... Apply Two Times A Day As Needed  Allergies (verified): No Known Drug Allergies  Past History:  Past Medical History: Reviewed history from 06/05/2008 and no changes required. 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 Tonsillectomy D & C 2009  Family History: Reviewed history from 06/05/2008 and no changes required. No FH of Colon Cancer: Family History of Diabetes: Uncles, Aunts Family History of Heart Disease: PGF, Mat Uncle Family History of Esophageal Cancer: [place]  Uncle Family History of Testicular Cancer: Father Family History of Prostate Cancer: Uncle Family History of Kidney Disease: MGM, chronic renal failure d/t HTN  Social History: Reviewed history from 06/05/2008 and no changes  required. Single Alcohol Use - no Illicit Drug Use - no Patient has never smoked.   Review of Systems  The patient denies allergy/sinus, anemia, anxiety-new, arthritis/joint pain, back pain, blood in urine, breast changes/lumps, change in vision, confusion, cough, coughing up blood, depression-new, fainting, fatigue, fever, headaches-new, hearing problems, heart murmur, heart rhythm changes, itching, menstrual pain, muscle pains/cramps, night sweats, nosebleeds, pregnancy symptoms, shortness of breath, skin rash, sleeping problems, sore throat, swelling of feet/legs, swollen lymph glands, thirst - excessive , urination - excessive , urination changes/pain, urine leakage, vision changes, and voice change.         Pertinent positive and negative review of systems were noted in the above HPI. All other ROS was otherwise negative.   Vital Signs:  Patient profile:   40 year old female Height:      67.5 inches Weight:      174.38 pounds BMI:     27.01 Pulse rate:   80 / minute Pulse rhythm:   regular BP sitting:   100 / 60  (right arm)  Vitals Entered By: Randye Lobo NCMA (Sep 10, 2009 3:57 PM)  Physical Exam  General:  Well developed, well nourished, no acute distress. Neck:  Supple; no masses or thyromegaly. Lungs:  Clear throughout to auscultation. Heart:  Regular rate and rhythm; no murmurs, rubs,  or bruits. Abdomen:  soft, nontender abdomen with normoactive bowel sounds. Rectal:  normal rectal tone, normal perianal area, dark Hemoccult negative stool. Msk:  Symmetrical with no gross deformities. Normal posture. Skin:  Intact without significant lesions or rashes. Psych:  Alert and cooperative. Normal mood and affect.   Impression & Recommendations:  Problem # 1:  GERD (ICD-530.81) Patient's GERD is under control with Protonix 40 mg twice a day.  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 She is to increase fiber  supplements to 3-4 g of Benefiber daily. We will schedule her for a colonoscopy with routine MiraLax prep.  Patient Instructions: 1)  colonoscopy scheduled for June 2011. 2)  MiraLax prep. 3)  Continue same medications. 4)  Increase the fiber supplements to Benefiber one heaping teaspoon daily. 5)  Continue Protonix 40 mg twice a day. 6)  Copy sent to : Dr Heath Gold 7)  The medication list was reviewed and reconciled.  All changed / newly prescribed medications were explained.  A complete medication list was provided to the patient / caregiver. Prescriptions: DULCOLAX 5 MG  TBEC (BISACODYL) Day before procedure take 2 at 3pm and 2 at 8pm.  #4 x 0   Entered by:   Madlyn Frankel CMA (AAMA)   Authorized by:   Lafayette Dragon MD   Signed by:   Madlyn Frankel CMA ([place]) on 09/11/2009   Method used:   Print then Mail to Patient   RxID:   [id number] REGLAN 10 MG  TABS (METOCLOPRAMIDE HCL) As per prep instructions.  #2 x 0   Entered by:   Madlyn Frankel CMA (AAMA)   Authorized by:   Lafayette Dragon MD   Signed by:   [place] ([place]) on 09/11/2009   Method used:   Print then Mail  to Patient   RxID:   [id number] MIRALAX   POWD (POLYETHYLENE GLYCOL 3350) As per prep  instructions.  #255gm x 0   Entered by:   Madlyn Frankel CMA (AAMA)   Authorized by:   Lafayette Dragon MD   Signed by:   Madlyn Frankel CMA ([place]) on 09/11/2009   Method used:   Print then Mail to Patient   RxID:   [id number]

## 2010-06-08 NOTE — Procedures (Signed)
Summary: Colonoscopy  Patient: [name] Note: All result statuses are Final unless otherwise noted.  Tests: (1) Colonoscopy (COL)   COL Colonoscopy           [facility].     [place], [place]  [address]           COLONOSCOPY PROCEDURE REPORT           PATIENT:  Meagan Hurley, Meagan Hurley  MR#:  [id number]     BIRTHDATE:  1970/06/01, 38 yrs. old  GENDER:  female     ENDOSCOPIST:  Lowella Bandy. Olevia Perches, MD     REF. BY:  Heath Gold, M.D.     PROCEDURE DATE:  10/23/2009     PROCEDURE:  Colonoscopy 98921     ASA CLASS:  Class I     INDICATIONS:  Crohn's disease Crohn's since 1997, colon     1999,2002,,2004,2007,2009, moved from [place] 2005, on Imuran     MEDICATIONS:   Versed 12 mg, Fentanyl 100 mcg, Benadryl 12.5 mg           DESCRIPTION OF PROCEDURE:   After the risks benefits and     alternatives of the procedure were thoroughly explained, informed     consent was obtained.  Digital rectal exam was performed and     revealed no rectal masses.   The LB [id number]-[id number] endoscope     was introduced through the anus and advanced to the terminal ileum     which was intubated for a short distance, without limitations.     The quality of the prep was excellent, using MiraLax.  The     instrument was then slowly withdrawn as the colon was fully     examined.     <<PROCEDUREIMAGES>>     FINDINGS:  Proctitis was identified. in the sigmoid colon. 0-20 cm     granular mucosa with mucosal hemorrhages With standard forceps,     biopsy was obtained and sent to pathology (see image7 and image6).     This was otherwise a normal examination of the colon. normal     appearing ileocecal valve, colon, random Bx's TI,R colon, L colon     and rectum 0-20 cm, no pseudopolyps Random biopsies were obtained     and sent to pathology (see image1, image2, image3, image4, and     image5).   Retroflexed views in the rectum revealed n     o abnormalities.    The scope was then  withdrawn from the patient     and the procedure completed.           COMPLICATIONS:  None     ENDOSCOPIC IMPRESSION:     1) Proctitis in the sigmoid colon     2) Otherwise normal examination     s/p random biopsies     RECOMMENDATIONS:     1) Await biopsy results     continue all meds,     REPEAT EXAM:  In 5 year(s) for.  consider Propafol           ______________________________     Lowella Bandy. Olevia Perches, MD           CC:           n.     eSIGNED:   Lowella Bandy. Zebbie Ace at 10/23/2009 02:22 PM           Fortunato Curling, [phone]  Note: An exclamation mark (!) indicates a result that was not dispersed into the flowsheet. Document Creation Date: 10/23/2009 2:23 PM _______________________________________________________________________  (1) Order result status: Final Collection or observation date-time: 10/23/2009 14:11 Requested date-time:  Receipt date-time:  Reported date-time:  Referring Physician:   Ordering Physician: Delfin Edis [phone]) Specimen Source:  Source: Tawanna Cooler Order Number: [id number] Lab site:   Appended Document: Colonoscopy 5 yr recall     Procedures Next Due Date:    Colonoscopy: 10/2014

## 2010-06-08 NOTE — Progress Notes (Signed)
Summary: Lab orders   Phone Note Call from Patient Call back at Home Phone [phone]   Caller: Patient Call For: Dr. Olevia Perches Reason for Call: Talk to Nurse Summary of Call: wants to know when she is due for labwork and if she is due fax order to [phone] Initial call taken by: Webb Laws,  February 01, 2010 10:38 AM  Follow-up for Phone Call        Dr Olevia Perches looks like she might be due for labs due to 6MP.  Please advise what labs you would like her to have if any.   Follow-up by: Barb Merino RN, [place],  February 01, 2010 10:45 AM  Additional Follow-up for Phone Call Additional follow up Details #1::        Last Lab work 10/2009, please order CBC with diff, Iron studies, C-met, B12, sed.rate. Additional Follow-up by: Lafayette Dragon MD,  February 01, 2010 12:44 PM    Additional Follow-up for Phone Call Additional follow up Details #2::    Orders sent at patient request to the above fax number. Follow-up by: Barb Merino RN, [place],  February 01, 2010 1:52 PM

## 2010-06-10 NOTE — Progress Notes (Signed)
Summary: update pharmay info, rx refills   Phone Note Call from Patient Call back at Home Phone [phone]   Caller: Patient Call For: Dr Olevia Perches Summary of Call: Patient calling to give her new pharmacy information [facility] [phone]  fax [phone] Patient also requesting refills for her Nexium, Colazol, 59m for a 90 days supply. She also wants to know if Dr BOlevia Perchesis wanting her to have labs and if so can the order be faxed to her job so that she can have it done there. Patient also wants to know if she needs to come in for a f/u even-though she is not having any type of problems at the moment. Initial call taken by: ERonalee Red  May 18, 2010 9:12 AM  Follow-up for Phone Call        Dr BOlevia Perches I am going to give patient refills on medications as she has requested. I know you wanted at least a CBC completed around this time but are there any other labs you would like patient to have? I will send orders for her to have completed at her office. Also, does patient need to be seen in office? Follow-up by: DMadlyn FrankelCMA (Deborra Medina,  May 18, 2010 11:06 AM  Additional Follow-up for Phone Call Additional follow up Details #1::        LOV 09/2009, before 02/2009, Last Labs 02/18/2010 were normal. If she is asymptomatic like she says she is, I can see her once a year, which will be 09/2010. Her CBC needs to be done q 3 months, so  she needs one now. All other labs  q 6 months, if she is having diarrhea, would add C-met. Additional Follow-up by: DLafayette DragonMD,  May 18, 2010 1:26 PM    Additional Follow-up for Phone Call Additional follow up Details #2::    I have left a message for the patient to call back. Dottie Nelson-Smith CMA (Deborra Medina  May 18, 2010 4:07 PM  Patient states that she is doing well and is NOT having diarrhea. I have advised her that she should be seen in the office once per year (her next visit would be due around  09/2010) as long as she is doing well. I have also advised her that she should have her CBC completed every 3 months and all other labs should be completed every 6 months, I will send orders to [phone] per Dr HIshmael Holterrequest. She would also like me to add "personal and confidential information." Dottie Nelson-Smith CMA (Deborra Medina  May 19, 2010 12:58 PM    CBC orders have been faxed to Dr HIshmael Holter Dottie Nelson-Smith CMA (AAMA)  May 19, 2010 2:01 PM   New/Updated Medications: COLAZAL 750 MG CAPS (BALSALAZIDE DISODIUM) take 3 tablets by mouth three times a day NEXIUM 40 MG CPDR (ESOMEPRAZOLE MAGNESIUM) Take 1 tablet by mouth once a day Prescriptions: MERCAPTOPURINE 50 MG TABS (MERCAPTOPURINE) Take 1 tablet by mouth once a day  #90 x 1   Entered by:   DMadlyn FrankelCMA (AAMA)   Authorized by:   DLafayette DragonMD   Signed by:   [place] on 05/18/2010   Method used:   Electronically to        [facility])       [address]      [place]  [address]  Ph: [phone]       Fax: [phone]   RxID:   [id number] NEXIUM 40 MG CPDR (ESOMEPRAZOLE MAGNESIUM) Take 1 tablet by mouth once a day  #90 x 1   Entered by:   Madlyn Frankel CMA ([place])   Authorized by:   Lafayette Dragon MD   Signed by:   [place] ([place]) on 05/18/2010   Method used:   Electronically to        [facility]* (retail)       [address]       [place], [place]  [address]       Ph: [phone]       Fax: [phone]   RxID:   [id number] COLAZAL 750 MG CAPS (BALSALAZIDE DISODIUM) take 3 tablets by mouth three times a day  #810 x 1   Entered by:   Madlyn Frankel CMA (AAMA)   Authorized by:   Lafayette Dragon MD   Signed by:   Daniel (AAMA) on 05/18/2010   Method used:   Electronically to        [facility]* (retail)       [address]       [place], [place]  [address]       Ph: [phone]       Fax: [phone]   RxID:   [id number]

## 2010-07-01 ENCOUNTER — Telehealth: Payer: Self-pay | Admitting: Internal Medicine

## 2010-07-06 NOTE — Progress Notes (Signed)
Summary: Medication  Medications Added FERROUS SULFATE 325 (65 FE) MG TABS (FERROUS SULFATE) Take 1 tablet by mouth once daily       Phone Note Call from Patient Call back at Home Phone [phone]   Caller: Patient Call For: Dr. Olevia Perches Reason for Call: Talk to Nurse Summary of Call: Needs a script for Iron Supplements 65 mg. for her Flex Spending Acct.Marland Kitchen..Marland Kitchenwill pickup script Initial call taken by: Webb Laws,  July 01, 2010 9:21 AM  Follow-up for Phone Call        Dr Olevia Perches- Do you want me to give patient prescription for this once daily?  Additional Follow-up for Phone Call Additional follow up Details #1::        yes please Additional Follow-up by: Lafayette Dragon MD,  July 01, 2010 10:13 AM    Additional Follow-up for Phone Call Additional follow up Details #2::    prescription created and awaiting patient pick up at front desk. Follow-up by: Madlyn Frankel CMA (AAMA),  July 01, 2010 11:51 AM  New/Updated Medications: FERROUS SULFATE 325 (65 FE) MG TABS (FERROUS SULFATE) Take 1 tablet by mouth once daily Prescriptions: FERROUS SULFATE 325 (65 FE) MG TABS (FERROUS SULFATE) Take 1 tablet by mouth once daily  #30 x 5   Entered by:   Madlyn Frankel CMA (AAMA)   Authorized by:   Lafayette Dragon MD   Signed by:   Madlyn Frankel CMA ([place]) on 07/01/2010   Method used:   Print then Give to Patient   RxID:   [id number]

## 2010-07-13 ENCOUNTER — Telehealth: Payer: Self-pay | Admitting: Internal Medicine

## 2010-07-20 NOTE — Progress Notes (Signed)
Summary: Medication   Phone Note Call from Patient Call back at Home Phone [phone]   Caller: Patient Call For: Dr. Olevia Perches Reason for Call: Talk to Nurse Summary of Call: Needs 2 OTC scripts for her [place]. for Sugar Free Fiber Powder 16.7oz. and Chewable Fiber Supplement #90 Initial call taken by: Webb Laws,  July 13, 2010 8:33 AM  Follow-up for Phone Call        I need to know what to write on sig. Left message for patient to call. Dottie Nelson-Smith CMA Deborra Medina)  July 13, 2010 9:33 AM  Per patient, she gave information yesterday to [facility] She has given me information again. Sugar Free Fiber Powder 2 teaspoons by mouth once daily #16.7 oz. Chewable Fiber Supplement 1.5 tablets by mouth once daily #90. Prescriptions have been created and are awaiting patient pick up at front desk. Dottie Nelson-Smith CMA (AAMA)  July 14, 2010 9:15 AM      New/Updated Medications: * CHEWABLE FIBER SUPPLEMENT Take 1.5 tablets by mouth once daily * SUGAR FREE FIBER SUPPLEMENT Take 2 teaspoons dissolved in 8 ounces water/juice and drink once daily Prescriptions: SUGAR FREE FIBER SUPPLEMENT Take 2 teaspoons dissolved in 8 ounces water/juice and drink once daily  #16.7 ounces x 5   Entered by:   Madlyn Frankel CMA (AAMA)   Authorized by:   Lafayette Dragon MD   Signed by:   Madlyn Frankel CMA (AAMA) on 07/14/2010   Method used:   Print then Give to Patient   RxID:   [id number] CHEWABLE FIBER SUPPLEMENT Take 1.5 tablets by mouth once daily  #90 x 5   Entered by:   Madlyn Frankel CMA (AAMA)   Authorized by:   Lafayette Dragon MD   Signed by:   [place] ([place]) on 07/14/2010   Method used:   Print then Give to Patient   RxID:   [id number]

## 2010-08-11 ENCOUNTER — Other Ambulatory Visit: Payer: Self-pay | Admitting: Internal Medicine

## 2010-08-11 MED ORDER — MESALAMINE 1000 MG RE SUPP: 1000.0000 mg | Freq: Every day | RECTAL | Status: DC

## 2010-08-11 NOTE — Telephone Encounter (Signed)
Patient requests canasa suppositories. She also requests that we no longer call her work number. I have taken her work number out of Epic and have made note of her requests. Canasa refills have been sent to her pharmacy per her request.

## 2010-08-11 NOTE — Telephone Encounter (Signed)
Left message for patient to call back. I need to know which suppositories she needs sent to her pharmacy

## 2010-08-16 ENCOUNTER — Telehealth: Payer: Self-pay | Admitting: *Deleted

## 2010-08-16 DIAGNOSIS — K5010 Crohn's disease of large intestine without complications: Secondary | ICD-10-CM

## 2010-08-16 NOTE — Telephone Encounter (Signed)
I originally called and left message for patient that she was due for labwork within the next couple weeks. She has called back and left voicemail stating that she has been having some back pain recently without any physical findings (on xray etc). She wonders if it could be related to her colitis in any way. She also states that she wonders if Dr Olevia Perches would like any additional labs since she is having this back pain. She has an appointment with a Sports Med MD for another evaluation of this pain but wanted Dr Nichola Sizer take on whether it may or may not be related to her colitis. Dr Olevia Perches, what are your thoughts on this and what labs would you like me to order?

## 2010-08-16 NOTE — Telephone Encounter (Signed)
It may be related to her Crohn's disease. Has she ever had a HLA typing  For B27 type to r/o ankylosing spondylitis. She might have had it in [place] before I met her. She will know. If not, then order. Also ANA, RF, anti DNA

## 2010-08-16 NOTE — Telephone Encounter (Signed)
Left message for patient to call back  

## 2010-08-17 NOTE — Telephone Encounter (Signed)
Per Dr Olevia Perches, d/c order for DNA test. Orders for cbc, ana, rheumatoid factor, and HLA for B27 antigen have been placed and faxed to patient at [phone] as she has requested.

## 2010-08-17 NOTE — Telephone Encounter (Signed)
Patient called back. She has never had a HLA typing for B27. I will add that to the testing that needs to be completed on patient.

## 2010-08-31 ENCOUNTER — Telehealth: Payer: Self-pay | Admitting: Internal Medicine

## 2010-08-31 NOTE — Telephone Encounter (Signed)
Noted  

## 2010-09-20 ENCOUNTER — Other Ambulatory Visit: Payer: Self-pay | Admitting: *Deleted

## 2010-09-20 NOTE — Telephone Encounter (Signed)
I have gotten a refill request for patients canasa suppositories to be given in 90 day supply. On 08/11/10, we did send a 90 day supply. I have called patient's pharmacy and have spoken to Maudie Mercury who states that patient was given only 30 because insurance will only allow patient to get #30 initially. I have asked her to provide patient with a 90 day rx this time and to make patient aware of this insurance requirement. Kim verbalizes understanding.

## 2010-09-21 NOTE — Assessment & Plan Note (Signed)
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  I have followed  her now since 2005.  She is due for a followup colonoscopy, last exam  being done in March 2007, showing Crohn's colitis and involvement of the  Crohn's disease in appendix.  Terminal ileum was normal.  She has had a  recent flare-up of her Crohn's disease, but has responded to steroids,  which have now been completely tapered off, and she continues with 6-  mercaptopurine 75 mg a day.  Her lab tests last week showed leukopenia,  white cell count 2900.  She remains asymptomatic, in reasonable control.  She is having one to two bowel movements a day, no rectal bleeding.  There has been occasional fleeting nausea and fleeting gas and bloating  with some crampy abdominal pain.  Claudeen has been on MiraLax 17 g daily  for three weeks out of each month, she is uusually off one week  prior  to her period.   Other medical problems include atopic dermatitis and thyroid nodule.  She also has hyperlipidemia.   Her prior colonoscopies were in 1997, 1999, 2002, 2004, and March 2007.  I have done only the last colonoscopy.   MEDICATIONS:  1. 6MP 75 mg daily.  2. Colazal 750 mg p.o. t.i.d.  3. Clarinex D one daily.  4. Iron 65 mg daily.  5. Nasonex.  6. Multiple vitamins.  7. Calcium.  8. Fish oil.  9. Protonix 40 mg q.a.m.  10.Canasa suppositories 1000 mg Monday, Wednesday, Friday and      Saturday.  11.Rowasa enemas Tuesday, Thursday, and Sunday.  12.Lexapro 5 mg p.r.n.  13.Antispasmodics p.r.n.   PHYSICAL EXAM:  Blood pressure 116/64, pulse 72 and weight 180 pounds.  She has marked hyperpigmentation of  her lower extremities, no evidence  of e. nodosum.  Abdominal exam is always limited because she is very ticklish, so I have  to be very careful how to examine her, but there was no tenderness in  any of the quadrants.  She has no distention and her bowel sounds were  normoactive.  Rectal exam was not done because she is scheduled for colonoscopy three  weeks from now.   IMPRESSION:  1. Patient is a 39 year old African-American female with rather stable      Crohn's colitis, off steroids, dependent on immunomodulators, 6MP      has been reduced from 75 mg to 50 mg a day due to leukopenia.  2. Functional constipation may be related to decreased colonic      motility.  3. 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   PLAN:  1. Decrease 6MP to 50 mg daily.  2. Continue Colazal 750 t.i.d., Canasa suppository, Rowasa enemas      until the next colonoscopy, at which time we  will determine if we      can taper off any of her medications.  3. Continue MiraLax 17 g on p.r.n. basis.     Lowella Bandy. Olevia Perches, MD  Electronically Signed    DMB/MedQ  DD: 07/06/2007  DT: 07/06/2007  Job #: [id number]   cc:   Royetta Crochet. Karlton Lemon, M.D.

## 2010-09-21 NOTE — Assessment & Plan Note (Signed)
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 Also  her IBD antibodies are more consistent with ulcerative colitis, left-  sided colitis. She had a recent excerebration of her symptoms and had to  be put back on prednisone 30 mg a day on December 08, 2006, when she ended  up in the emergency room. We increased her regiment of 6MP 50 mg a day,  prednisone 30 mg a day, Colazal 750 mg 3 times a day, as well as iron  supplements, and Canasa suppositories 1000 mg daily. She has been able  to decrease her prednisone to 20 mg a day and remains asymptomatic.  There has been no abdominal pain, rectal bleeding, diarrhea, or any  mucus. She has had some constipation. She has cut back on her iron to 1  a day thinking that it was causing constipation but later on she found  out it was the Colazal. She is now taking MiraLax p.r.n. constipation.  There has been increased gastroesophageal reflux for which she took  Prilosec but now would like to take Protonix 40 mg daily.   MEDICATIONS:  1. Prednisone 25 mg p.o. daily. She is going to go to 20 mg in the      next 2 days.  2. 6MP 50 mg p.o. daily.  3. Colazal 750 mg t.i.d.  4. Clarinex D 1 daily.  5. Omeprazole 20 mg daily.  6. Canasa suppositories 1000 mg at bedtime.  7. Iron 65 mg daily.  8. Nasonex.  9. MiraLax.  10.Multivitamin.  11.Calcium.  12.Fish oil.   PHYSICAL EXAMINATION:  Blood pressure 112/58, pulse 84, weight 182  pounds and stable. She appeared in no distress, not cushingoid.  Sclerae, nonicteric.  LUNGS: Clear to auscultation.  COR: Normal S1, S2.  ABDOMEN: Unremarkable with soft bowel sounds. No palpable tenderness or  mass.  RECTAL EXAM: Normal rectal tone. No perianal disease. Stool was  Hemoccult negative.   IMPRESSION:  40. 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 She is doing well on tapering      dose of prednisone.  2. Gastroesophageal reflux with recent excerebration. This could be      related to either stress or steroids.   PLAN:  1. Next colonoscopy in March 2009.  2. May advance to regular diet.  3. Continue prednisone taper by 5 mg every 2 weeks.  4. Discontinue Canasa suppositories.  5. All other medications continue.   I will see her again in 8 weeks.     Lowella Bandy. Olevia Perches, MD  Electronically Signed    DMB/MedQ  DD: 01/04/2007  DT: 01/05/2007  Job #: [id number]   cc:   Royetta Crochet. Karlton Lemon, M.D.

## 2010-09-21 NOTE — Assessment & Plan Note (Signed)
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  She  has Crohn's disease of the colon, diagnosed in 1997.  Last colonoscopy  March 2007, next colonoscopy scheduled for March 2009.  She has had  flare up of her disease this past summer and has finally gone into  remission.  She is on multiple medications which she takes on a regular  basis.  Her prednisone is now slowly tapered by 2.5 mg every 2 weeks,  she is currently on 7.5 mg and is planning to go down to 5 mg in the  next 2 days.  We have increased her 6-mercaptopurine from 50-75 mg a day  and we will obtain metabolite levels today.  She is also on Colazal 750  mg three tablets 3 times a day and Protonix 40 mg daily.  She was on  MiraLax 17 grams daily but has switched to pear juice with quite  success.  She is also on a mesalamine enemas on a daily basis for past 3  weeks with complete resolution of any rectal symptoms.  She has had no  gastroesophageal reflux symptoms in the last 2 weeks.  Her level of  energy is good, she has not missed any work.  She denies rectal bleeding  or crampy abdominal pain.   PHYSICAL EXAMINATION:  Blood pressure 104/66, pulse 92 and weight 183  pounds.  She was in no distress, no cushingoid features.  Sclerae was  nonicteric.  NECK:  Supple, no adenopathy.  LUNGS:  Clear to auscultation.  COR:  With normal S1, normal S2.  ABDOMEN:  Soft, relaxed, I could not elicit any tenderness.  RECTAL:  With normal perianal area, normal rectal tone, stool was soft  and Hemoccult negative today.   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   PLAN:  Changes in the regimen are as follows:  1. Continue same dose of Colazal and 6-MP, metabolite levels pending      today.  2. Decrease mesalamine enemas to every other night and alternate it      with Canasa suppositories 1000 mg every other night.  3. Increase Protonix to 40 mg p.o. daily.  4. Due for colonoscopy March 2009, office visit before her      colonoscopy.  5. Blood work planned for last week in February 2009, CBC, CMET and      sed rate.  Refills given for [facility] by electronic prescribing.    Lowella Bandy. Olevia Perches, MD  Electronically Signed   DMB/MedQ  DD: 04/19/2007  DT: 04/20/2007  Job #: [id number]   cc:   Royetta Crochet. Karlton Lemon, M.D.

## 2010-09-24 NOTE — Assessment & Plan Note (Signed)
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  Last colonoscopy in  March 2007 showed mild chronic colitis in the sigmoid colon with some  nonspecific cell disarray on the random biopsies of the descending and  transverse colon.  Cecum and terminal ileum biopsies were completely  normal.  She had a mild flare-up of the colitis in September and was  temporarily on prednisone, which was tapered off with complete  resolution of her symptoms.  She is currently on Canasa suppositories  1000 mg at bedtime and mesalamine 1200 mg 3 times a day.  She sometimes  skips her midday dose.  She is having regular bowel movements, no rectal  bleeding or abdominal pain.  Blood tests by Dr. Karlton Lemon recently were  satisfactory, but we do not have really the results of them.   PHYSICAL EXAM:  Blood pressure 110/70, pulse 70.  Weight 185 pounds.  She was alert, oriented, in no distress.  ABDOMEN:  Soft and nontender with normoactive bowel sounds.  No  distention.  RECTAL:  Exam not done.  EXTREMITIES:  No edema.   IMPRESSION:  [age]-year-old African American female with Crohn's  colitis, currently in remission.   PLAN:  1. Switch from Asacol or Lialda 1.2 g three tablets daily.  2. Decrease Canasa suppositories to p.r.n. only.  3. Obtain results of the CBC and CMET studies, as well as iron studies      from Dr. Karlton Lemon.  I advised the patient to take iron supplements      on a p.r.n. basis.     Lowella Bandy. Olevia Perches, MD  Electronically Signed    DMB/MedQ  DD: 04/26/2006  DT: 04/27/2006  Job #: [id number]   cc:   Royetta Crochet. Karlton Lemon, M.D.

## 2010-10-05 ENCOUNTER — Telehealth: Payer: Self-pay | Admitting: Internal Medicine

## 2010-10-05 NOTE — Telephone Encounter (Signed)
Left a message for patient to call me back.

## 2010-10-06 NOTE — Telephone Encounter (Signed)
Received a message from patient to call and leave her a detailed message of what I need to know and she will call. Left a message for patient to let me know her symptoms she is having.?diarrhea etc.

## 2010-10-06 NOTE — Telephone Encounter (Signed)
Patient left me a message with her symptoms. On May 15 or 16, she had a small amount of bright, red blood on tissue when she wiped. No hx of hemorrhoids just colitis. On May 27, more BRB when she wiped. No blood on stool. On Sunday, had nausea. No pain. Subjective fever on Sunday and today but did not take temperature just felt like she had a fever. Patient notes she is stressed at work and has been on vacation which was fun but stressful also. Usually has 1-2 bowel movements/day. No bowel movement since Sunday.  Thinks she is having a mild flare and would like a new rx for Mesalamine enemas.  She will take Prednisone if Dr. Olevia Perches wants her too. She is taking Colazal TID and while she was on vacation she took 6 MP 1 tab instead of usual 1/2. Please, advise

## 2010-10-06 NOTE — Telephone Encounter (Signed)
Left message for patient to call me back. 

## 2010-10-06 NOTE — Telephone Encounter (Signed)
OK to take 6 MP 50 mg qd x 4 weeks, then reassess. Prednisone 20 mg, x 2 weeks 15 mg x 2 weeks, 10 mg po qd x 2 weeks, 5 mg po mq x  2 weeks, then call back with an update. OK to refill the enemas., #30 1 hs

## 2010-10-07 MED ORDER — PREDNISONE 10 MG PO TABS: ORAL_TABLET | ORAL | Status: DC

## 2010-10-07 MED ORDER — ROWASA 4 G RE KIT: PACK | RECTAL | Status: DC

## 2010-10-07 NOTE — Telephone Encounter (Signed)
Called and left patient a message with Dr. Nichola Sizer recommendations. Rx sent to pharmacy for Prednisone and enemas. Told patient to call me if she needs a new rx for 6 MP.

## 2010-10-08 ENCOUNTER — Telehealth: Payer: Self-pay | Admitting: *Deleted

## 2010-10-08 NOTE — Telephone Encounter (Signed)
Can patient get just Rowasa enemas instead of Kit. Kit comes in #7 and enemas come #28 and cost less. Called Joelene Millin and told her it is okay to get enemas instead of kit.

## 2010-10-21 ENCOUNTER — Ambulatory Visit: Payer: Self-pay | Admitting: Internal Medicine

## 2010-12-16 ENCOUNTER — Encounter: Payer: Self-pay | Admitting: Internal Medicine

## 2010-12-16 ENCOUNTER — Ambulatory Visit (INDEPENDENT_AMBULATORY_CARE_PROVIDER_SITE_OTHER): Payer: Commercial Managed Care - PPO | Admitting: Internal Medicine

## 2010-12-16 VITALS — BP 118/64 | HR 88 | Ht 68.0 in | Wt 179.0 lb

## 2010-12-16 DIAGNOSIS — K5090 Crohn's disease, unspecified, without complications: Secondary | ICD-10-CM

## 2010-12-16 DIAGNOSIS — K5010 Crohn's disease of large intestine without complications: Secondary | ICD-10-CM

## 2010-12-16 NOTE — Progress Notes (Signed)
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 She moved to [place] in 2005. Her prior colonoscopies were in 1997, 1999, 2002, 2007, 2009 and most recently in June 2011. Her last colonoscopy showed chronic active colitis from 0-20 cm. She has been on maintenance therapy of Colazal 750 mg 1 by mouth 3 times a day except on Saturdays and Sundays when she takes 750 mg 3 by mouth twice a day. She is on 6-MP 25 mg a day on Saturdays and Sundays and 50 mg from Monday to Thursday. She has stopped taking Nexium because of the cost. She is currently on Prevacid 15 mg twice a day. Her last blood count showed a hemoglobin 12.3 which is down from 13.5 in Jan 2012. She denies any visible blood per rectum, diarrhea or abdominal pain. She denies stomatitis, low back pain or change in weight. She takes Canasa suppositories 1,000 mg 5 days a week.   Past Medical History  Diagnosis Date  . GERD (gastroesophageal reflux disease)   . Eczema   . Hyperlipidemia   . Crohn disease   . Premenstrual tension syndromes    Past Surgical History  Procedure Date  . Tonsillectomy   . Dilation and curettage of uterus 2009    reports that she has never smoked. She has never used smokeless tobacco. She reports that she does not drink alcohol or use illicit drugs. family history includes Diabetes in some unspecified family members; Heart disease in her maternal uncle and paternal grandfather; Kidney disease in her maternal grandmother; Prostate cancer in an unspecified family member; and Testicular cancer in her father.  There is no history of Colon cancer. No Known Allergies      Review of Systems: Negative for abdominal pain. Positive for occasional constipation for which he takes prunes negative for rectal bleeding chest pain shortness of breath  The remainder  of the 10  point ROS is negative except as outlined in H&P   Physical Exam: General appearance  Well developed, in no distress. Eyes- non icteric. HEENT nontraumatic, normocephalic. Mouth no lesions, tongue papillated, no cheilosis. Neck supple without adenopathy, thyroid not enlarged, no carotid bruits, no JVD. Lungs Clear to auscultation bilaterally. Cor normal S1 normal S2, regular rhythm , no murmur,  quiet precordium. Abdomen soft nontender abdomen without distention. Normal active bowel sounds. No palpable mass. Right lower quadrant unremarkable Rectal: Normal rectal tone, soft Hemoccult negative stool Extremities no pedal edema. Skin no lesions. Neurological alert and oriented x 3. Psychological normal mood and affect.  Assessment and Plan:  Problem #1 Crohn's disease of the colon without involvement of the rectum. She is in symptomatic remission on a maintenance dose of Colazal and 6-MP. Her disease is limited to the rectosigmoid. She has done extremely well for the past several years. She has had problems with anemia and so she ought to continue her iron supplements. We will recheck her blood count. Her next colonoscopy will be due in 5 years. She will have blood tests every 3-4 months, specifically CBC, as long as she stays on 6-MP. I will see her in one year or earlier if there are any issues.   12/16/2010 Delfin Edis

## 2010-12-16 NOTE — Patient Instructions (Signed)
Please get labs completed at your facility.

## 2010-12-22 ENCOUNTER — Other Ambulatory Visit: Payer: Self-pay | Admitting: Internal Medicine

## 2010-12-23 LAB — COMPREHENSIVE METABOLIC PANEL
Albumin: 4.6 g/dL (ref 3.5–5.2)
CO2: 26 mEq/L (ref 19–32)
Chloride: 103 mEq/L (ref 96–112)
Glucose, Bld: 60 mg/dL — ABNORMAL LOW (ref 70–99)
Potassium: 4.2 mEq/L (ref 3.5–5.3)
Sodium: 139 mEq/L (ref 135–145)
Total Protein: 7.5 g/dL (ref 6.0–8.3)

## 2010-12-23 LAB — CBC WITH DIFFERENTIAL/PLATELET
HCT: 41.7 % (ref 36.0–46.0)
Hemoglobin: 13.4 g/dL (ref 12.0–15.0)
Lymphocytes Relative: 26 % (ref 12–46)
Lymphs Abs: 0.9 10*3/uL (ref 0.7–4.0)
MCHC: 32.1 g/dL (ref 30.0–36.0)
Monocytes Absolute: 0.3 10*3/uL (ref 0.1–1.0)
Monocytes Relative: 8 % (ref 3–12)
Neutro Abs: 2.1 10*3/uL (ref 1.7–7.7)
Neutrophils Relative %: 62 % (ref 43–77)
RBC: 4.55 MIL/uL (ref 3.87–5.11)
WBC: 3.4 10*3/uL — ABNORMAL LOW (ref 4.0–10.5)

## 2010-12-23 LAB — SEDIMENTATION RATE: Sed Rate: 7 mm/hr (ref 0–22)

## 2011-02-14 ENCOUNTER — Telehealth: Payer: Self-pay | Admitting: Internal Medicine

## 2011-02-14 NOTE — Telephone Encounter (Signed)
Sent to Med. Records.

## 2011-02-21 LAB — URINALYSIS, ROUTINE W REFLEX MICROSCOPIC
Bilirubin Urine: NEGATIVE
Ketones, ur: 15 — AB
Nitrite: NEGATIVE
Urobilinogen, UA: 0.2

## 2011-02-21 LAB — POCT PREGNANCY, URINE
Operator id: 234501
Preg Test, Ur: NEGATIVE

## 2011-02-21 LAB — CBC
HCT: 38
MCHC: 32
Platelets: 241
RDW: 16.5 — ABNORMAL HIGH

## 2011-02-21 LAB — BASIC METABOLIC PANEL
BUN: 3 — ABNORMAL LOW
CO2: 24
GFR calc non Af Amer: >60
Glucose, Bld: 73
Potassium: 3.6

## 2011-02-22 ENCOUNTER — Telehealth: Payer: Self-pay | Admitting: Internal Medicine

## 2011-02-22 NOTE — Telephone Encounter (Signed)
Please fax lab order for Dr Ishmael Holter: CBC, C.met, sed.rate

## 2011-02-22 NOTE — Telephone Encounter (Signed)
Patient states she is due for lab work and would like to have an order faxed to her so she can have it done at her work place. She is on 6 MP and she had a CBC, Sed rate and Cmet on 12/22/10. Please, advise.

## 2011-02-23 ENCOUNTER — Other Ambulatory Visit: Payer: Self-pay | Admitting: Internal Medicine

## 2011-02-23 MED ORDER — HYDROCORTISONE ACE-PRAMOXINE 2.5-1 % RE CREA: 1.0000 "application " | TOPICAL_CREAM | Freq: Three times a day (TID) | RECTAL | Status: DC | PRN

## 2011-02-23 NOTE — Telephone Encounter (Signed)
rx sent

## 2011-02-23 NOTE — Telephone Encounter (Signed)
Faxed lab order over to patient. Left a message for her that this has been done.

## 2011-03-03 ENCOUNTER — Other Ambulatory Visit: Payer: Self-pay | Admitting: Internal Medicine

## 2011-03-03 LAB — COMPREHENSIVE METABOLIC PANEL
Alkaline Phosphatase: 41 U/L (ref 39–117)
BUN: 8 mg/dL (ref 6–23)
Creat: 0.69 mg/dL (ref 0.50–1.10)
Glucose, Bld: 49 mg/dL — ABNORMAL LOW (ref 70–99)
Total Bilirubin: 0.5 mg/dL (ref 0.3–1.2)

## 2011-03-04 LAB — CBC WITH DIFFERENTIAL/PLATELET
Basophils Absolute: 0 10*3/uL (ref 0.0–0.1)
Basophils Relative: 1 % (ref 0–1)
Eosinophils Relative: 2 % (ref 0–5)
HCT: 43.2 % (ref 36.0–46.0)
MCHC: 31.9 g/dL (ref 30.0–36.0)
MCV: 92.3 fL (ref 78.0–100.0)
Monocytes Absolute: 0.3 10*3/uL (ref 0.1–1.0)
Neutro Abs: 2.2 10*3/uL (ref 1.7–7.7)
RDW: 14.3 % (ref 11.5–15.5)

## 2011-03-08 ENCOUNTER — Encounter: Payer: Self-pay | Admitting: Internal Medicine

## 2011-03-29 ENCOUNTER — Telehealth: Payer: Self-pay | Admitting: *Deleted

## 2011-03-29 NOTE — Telephone Encounter (Signed)
Message copied by Hulan Saas on Tue Mar 29, 2011  9:06 AM ------      Message from: Lafayette Dragon      Created: Mon Mar 28, 2011 10:23 PM       Please call pt with all labs being normal except low Blood Sugar, which has always been low

## 2011-03-29 NOTE — Telephone Encounter (Signed)
Message copied by Hulan Saas on Tue Mar 29, 2011  9:07 AM ------      Message from: Lafayette Dragon      Created: Mon Mar 28, 2011 10:23 PM       Please call pt with all labs being normal except low Blood Sugar, which has always been low

## 2011-03-29 NOTE — Telephone Encounter (Signed)
Left a message for patient to call me for lab results

## 2011-04-04 NOTE — Progress Notes (Signed)
See phone note

## 2011-04-05 NOTE — Telephone Encounter (Signed)
I have sent a letter to the patient as well as a copy of her labwork to her home address as this patient is often difficult to reach by phone due to her busy schedule.

## 2011-05-13 ENCOUNTER — Encounter (HOSPITAL_COMMUNITY): Payer: Self-pay | Admitting: *Deleted

## 2011-05-17 ENCOUNTER — Telehealth: Payer: Self-pay | Admitting: Internal Medicine

## 2011-05-17 NOTE — Telephone Encounter (Signed)
Patient calling to see if she needs labs drawn. She had CBC,Sed rate and CMET on 03/03/11. Do you want to repeat this at this time? Please, advise.

## 2011-05-17 NOTE — Telephone Encounter (Signed)
Please order CBC,sed.rate, Iron studies and C-met

## 2011-05-18 NOTE — Telephone Encounter (Signed)
Faxed order to fax number patient left. Left a message for patient that rx has been faxed.

## 2011-05-30 ENCOUNTER — Encounter (HOSPITAL_COMMUNITY): Payer: Self-pay | Admitting: Pharmacist

## 2011-06-06 ENCOUNTER — Telehealth: Payer: Self-pay | Admitting: *Deleted

## 2011-06-06 NOTE — Telephone Encounter (Signed)
Left a message for patient that Dr. Olevia Perches did not get lab results.

## 2011-06-06 NOTE — Telephone Encounter (Signed)
Message copied by Hulan Saas on Mon Jun 06, 2011 10:01 AM ------      Message from: Hulan Saas      Created: Wed May 25, 2011  8:41 AM       Did patient get labs for DB

## 2011-06-07 ENCOUNTER — Other Ambulatory Visit: Payer: Self-pay | Admitting: Obstetrics and Gynecology

## 2011-06-10 ENCOUNTER — Ambulatory Visit (HOSPITAL_COMMUNITY)
Admission: RE | Admit: 2011-06-10 | Discharge: 2011-06-10 | Disposition: A | Discharge status: Home / Self Care | Payer: Commercial Managed Care - PPO | Source: Ambulatory Visit | Attending: Obstetrics and Gynecology | Admitting: Obstetrics and Gynecology

## 2011-06-10 ENCOUNTER — Telehealth: Payer: Self-pay | Admitting: *Deleted

## 2011-06-10 ENCOUNTER — Encounter (HOSPITAL_COMMUNITY): Payer: Self-pay | Admitting: Anesthesiology

## 2011-06-10 ENCOUNTER — Encounter (HOSPITAL_COMMUNITY)
Admission: RE | Disposition: A | Discharge status: Home / Self Care | Payer: Self-pay | Source: Ambulatory Visit | Attending: Obstetrics and Gynecology

## 2011-06-10 ENCOUNTER — Other Ambulatory Visit: Payer: Self-pay | Admitting: Obstetrics and Gynecology

## 2011-06-10 ENCOUNTER — Ambulatory Visit (HOSPITAL_COMMUNITY): Payer: Commercial Managed Care - PPO | Admitting: Anesthesiology

## 2011-06-10 DIAGNOSIS — N938 Other specified abnormal uterine and vaginal bleeding: Secondary | ICD-10-CM | POA: Insufficient documentation

## 2011-06-10 DIAGNOSIS — N949 Unspecified condition associated with female genital organs and menstrual cycle: Secondary | ICD-10-CM | POA: Insufficient documentation

## 2011-06-10 DIAGNOSIS — N840 Polyp of corpus uteri: Secondary | ICD-10-CM | POA: Insufficient documentation

## 2011-06-10 SURGERY — DILATATION & CURETTAGE/HYSTEROSCOPY WITH RESECTOCOPE
Anesthesia: General | Site: Vagina | Wound class: Clean Contaminated

## 2011-06-10 MED ORDER — MIDAZOLAM HCL 2 MG/2ML IJ SOLN
INTRAMUSCULAR | Status: AC
  Filled 2011-06-10: qty 2

## 2011-06-10 MED ORDER — KETOROLAC TROMETHAMINE 60 MG/2ML IM SOLN
INTRAMUSCULAR | Status: AC
  Filled 2011-06-10: qty 2

## 2011-06-10 MED ORDER — ONDANSETRON HCL 4 MG/2ML IJ SOLN
INTRAMUSCULAR | Status: DC | PRN
  Administered 2011-06-10: 4 mg via INTRAVENOUS

## 2011-06-10 MED ORDER — ACETAMINOPHEN 325 MG PO TABS: 325.0000 mg | ORAL_TABLET | ORAL | Status: DC | PRN

## 2011-06-10 MED ORDER — FENTANYL CITRATE 0.05 MG/ML IJ SOLN: 25.0000 ug | INTRAMUSCULAR | Status: DC | PRN

## 2011-06-10 MED ORDER — IBUPROFEN 800 MG PO TABS: 800.0000 mg | ORAL_TABLET | Freq: Three times a day (TID) | ORAL | Status: AC | PRN

## 2011-06-10 MED ORDER — KETOROLAC TROMETHAMINE 30 MG/ML IJ SOLN: 15.0000 mg | Freq: Once | INTRAMUSCULAR | Status: DC | PRN

## 2011-06-10 MED ORDER — MIDAZOLAM HCL 5 MG/5ML IJ SOLN
INTRAMUSCULAR | Status: DC | PRN
  Administered 2011-06-10: 2 mg via INTRAVENOUS

## 2011-06-10 MED ORDER — LIDOCAINE HCL (CARDIAC) 20 MG/ML IV SOLN
INTRAVENOUS | Status: AC
  Filled 2011-06-10: qty 5

## 2011-06-10 MED ORDER — GLYCINE 1.5 % IR SOLN
Status: DC | PRN
  Administered 2011-06-10: 3000 mL

## 2011-06-10 MED ORDER — FENTANYL CITRATE 0.05 MG/ML IJ SOLN
INTRAMUSCULAR | Status: DC | PRN
  Administered 2011-06-10 (×2): 50 ug via INTRAVENOUS

## 2011-06-10 MED ORDER — PROMETHAZINE HCL 25 MG/ML IJ SOLN: 6.2500 mg | INTRAMUSCULAR | Status: DC | PRN

## 2011-06-10 MED ORDER — LIDOCAINE HCL 2 % IJ SOLN
INTRAMUSCULAR | Status: AC
  Filled 2011-06-10: qty 1

## 2011-06-10 MED ORDER — LACTATED RINGERS IV SOLN
INTRAVENOUS | Status: DC | PRN
  Administered 2011-06-10 (×2): via INTRAVENOUS

## 2011-06-10 MED ORDER — PROPOFOL 10 MG/ML IV EMUL
INTRAVENOUS | Status: DC | PRN
  Administered 2011-06-10: 150 mg via INTRAVENOUS

## 2011-06-10 MED ORDER — DEXAMETHASONE SODIUM PHOSPHATE 10 MG/ML IJ SOLN
INTRAMUSCULAR | Status: DC | PRN
  Administered 2011-06-10: 10 mg via INTRAVENOUS

## 2011-06-10 MED ORDER — ONDANSETRON HCL 4 MG/2ML IJ SOLN
INTRAMUSCULAR | Status: AC
  Filled 2011-06-10: qty 2

## 2011-06-10 MED ORDER — DEXAMETHASONE SODIUM PHOSPHATE 10 MG/ML IJ SOLN
INTRAMUSCULAR | Status: AC
  Filled 2011-06-10: qty 1

## 2011-06-10 MED ORDER — FENTANYL CITRATE 0.05 MG/ML IJ SOLN
INTRAMUSCULAR | Status: AC
  Filled 2011-06-10: qty 2

## 2011-06-10 MED ORDER — LIDOCAINE HCL (CARDIAC) 20 MG/ML IV SOLN
INTRAVENOUS | Status: DC | PRN
  Administered 2011-06-10: 40 mg via INTRAVENOUS

## 2011-06-10 MED ORDER — CHLOROPROCAINE HCL 1 % IJ SOLN
INTRAMUSCULAR | Status: AC
  Filled 2011-06-10: qty 30

## 2011-06-10 MED ORDER — KETOROLAC TROMETHAMINE 30 MG/ML IJ SOLN
INTRAMUSCULAR | Status: DC | PRN
  Administered 2011-06-10: 30 mg via INTRAVENOUS
  Administered 2011-06-10: 30 mg via INTRAMUSCULAR

## 2011-06-10 MED ORDER — LACTATED RINGERS IV SOLN
INTRAVENOUS | Status: DC
  Administered 2011-06-10: 12:00:00 via INTRAVENOUS

## 2011-06-10 MED ORDER — PROPOFOL 10 MG/ML IV EMUL
INTRAVENOUS | Status: AC
  Filled 2011-06-10: qty 20

## 2011-06-10 SURGICAL SUPPLY — 17 items
CANISTER SUCTION 2500CC (MISCELLANEOUS) ×2 IMPLANT
CATH ROBINSON RED A/P 16FR (CATHETERS) ×2 IMPLANT
CLOTH BEACON ORANGE TIMEOUT ST (SAFETY) ×2 IMPLANT
CONTAINER PREFILL 10% NBF 60ML (FORM) ×4 IMPLANT
ELECT REM PT RETURN 9FT ADLT (ELECTROSURGICAL) ×2
ELECTRODE REM PT RTRN 9FT ADLT (ELECTROSURGICAL) ×1 IMPLANT
ELECTRODE ROLLER VERSAPOINT (ELECTRODE) IMPLANT
ELECTRODE RT ANGLE VERSAPOINT (CUTTING LOOP) IMPLANT
GLOVE BIO SURGEON STRL SZ 6.5 (GLOVE) ×2 IMPLANT
GLOVE BIOGEL PI IND STRL 7.0 (GLOVE) ×2 IMPLANT
GLOVE BIOGEL PI INDICATOR 7.0 (GLOVE) ×2
GOWN PREVENTION PLUS LG XLONG (DISPOSABLE) ×4 IMPLANT
GOWN STRL REIN XL XLG (GOWN DISPOSABLE) ×2 IMPLANT
LOOP ANGLED CUTTING 22FR (CUTTING LOOP) IMPLANT
PACK HYSTEROSCOPY LF (CUSTOM PROCEDURE TRAY) ×2 IMPLANT
TOWEL OR 17X24 6PK STRL BLUE (TOWEL DISPOSABLE) ×4 IMPLANT
WATER STERILE IRR 1000ML POUR (IV SOLUTION) ×2 IMPLANT

## 2011-06-10 NOTE — Preoperative (Signed)
Beta Blockers   Reason not to administer Beta Blockers:Not Applicable 

## 2011-06-10 NOTE — Anesthesia Postprocedure Evaluation (Signed)
Anesthesia Post Note  Patient: Meagan Hurley  Procedure(s) Performed:  DILATATION & CURETTAGE/HYSTEROSCOPY WITH RESECTOCOPE  Anesthesia type: General  Patient location: PACU  Post pain: Pain level controlled  Post assessment: Post-op Vital signs reviewed  Last Vitals:  Filed Vitals:   06/10/11 1530  BP:   Pulse: 68  Temp:   Resp: 16    Post vital signs: Reviewed  Level of consciousness: sedated  Complications: No apparent anesthesia complications

## 2011-06-10 NOTE — Anesthesia Preprocedure Evaluation (Signed)
Anesthesia Evaluation  Patient identified by MRN, date of birth, ID band Patient awake    Reviewed: Allergy & Precautions, H&P , Patient's Chart, lab work & pertinent test results, reviewed documented beta blocker date and time   History of Anesthesia Complications Negative for: history of anesthetic complications  Airway Mallampati: II TM Distance: >3 FB Neck ROM: full    Dental No notable dental hx.    Pulmonary neg pulmonary ROS,  clear to auscultation  Pulmonary exam normal       Cardiovascular Exercise Tolerance: Good neg cardio ROS regular Normal    Neuro/Psych Negative Neurological ROS  Negative Psych ROS   GI/Hepatic negative GI ROS, Neg liver ROS, GERD-  Controlled,  Endo/Other  Negative Endocrine ROS  Renal/GU negative Renal ROS     Musculoskeletal   Abdominal   Peds  Hematology negative hematology ROS (+)   Anesthesia Other Findings crohns  Reproductive/Obstetrics negative OB ROS                           Anesthesia Physical Anesthesia Plan  ASA: II  Anesthesia Plan: General LMA   Post-op Pain Management:    Induction:   Airway Management Planned:   Additional Equipment:   Intra-op Plan:   Post-operative Plan:   Informed Consent: I have reviewed the patients History and Physical, chart, labs and discussed the procedure including the risks, benefits and alternatives for the proposed anesthesia with the patient or authorized representative who has indicated his/her understanding and acceptance.   Dental Advisory Given  Plan Discussed with: CRNA, Surgeon and Anesthesiologist  Anesthesia Plan Comments:         Anesthesia Quick Evaluation

## 2011-06-10 NOTE — Telephone Encounter (Signed)
Left voicemail for patient that per Dr Olevia Perches, labs are normal.

## 2011-06-10 NOTE — Brief Op Note (Signed)
06/10/2011  2:11 PM  PATIENT:  Meagan Hurley  41 y.o. female  PRE-OPERATIVE DIAGNOSIS:  Dysfunctional Uterine Bleeding, Endometrial Mass  POST-OPERATIVE DIAGNOSIS:  Dysfunctional Uterine Bleeding, Endometrial Mass  PROCEDURE:  Procedure(s): DILATATION & CURETTAGE/ DIAGNOSTIC HYSTEROSCOPY, HYSTEROSCOPIC RESECTION OF ENDOMETRIAL POLYP  SURGEON:  Surgeon(s): Marvene Staff, MD  PHYSICIAN ASSISTANT:   ASSISTANTS: none   ANESTHESIA:   none  EBL:  Total I/O In: 1000 [I.V.:1000] Out: 20 [Blood:20]  BLOOD ADMINISTERED:none  DRAINS: none   LOCAL MEDICATIONS USED:  NONE  SPECIMEN:  Source of Specimen:  EMC, ENDOM POLYP  DISPOSITION OF SPECIMEN:  PATHOLOGY  COUNTS:  YES  TOURNIQUET:  * No tourniquets in log *  DICTATION: .Other Dictation: Dictation Number [id number]  PLAN OF CARE: Discharge to home after PACU  PATIENT DISPOSITION:  PACU - hemodynamically stable.   Delay start of Pharmacological VTE agent (>24hrs) due to surgical blood loss or risk of bleeding:  {YES/NO/NOT APPLICABLE:20182

## 2011-06-10 NOTE — Transfer of Care (Signed)
Immediate Anesthesia Transfer of Care Note  Patient: Meagan Hurley  Procedure(s) Performed:  DILATATION & CURETTAGE/HYSTEROSCOPY WITH RESECTOCOPE  Patient Location: PACU  Anesthesia Type: General  Level of Consciousness: awake, alert  and patient cooperative  Airway & Oxygen Therapy: Patient Spontanous Breathing and Patient connected to nasal cannula oxygen  Post-op Assessment: Report given to PACU RN  Post vital signs: Reviewed  Complications: No apparent anesthesia complications

## 2011-06-11 NOTE — Op Note (Signed)
Meagan Hurley, Meagan Hurley               ACCOUNT NO.:  [id number]  MEDICAL RECORD NO.:  [id number]  LOCATION:  [facility]                          FACILITY:  [facility]  PHYSICIAN:  Servando Salina, M.D.DATE OF BIRTH:  03-19-1971  DATE OF PROCEDURE:  06/10/2011 DATE OF DISCHARGE:  06/10/2011                              OPERATIVE REPORT   PREOPERATIVE DIAGNOSIS:  Dysfunction uterine bleeding, endometrial mass.  PROCEDURES:  Diagnostic hysteroscopy, hysteroscopic resection of endometrial polyp, dilation and curettage.  POSTOPERATIVE DIAGNOSIS:  Endometrial polyp, dysfunctional uterine bleeding.  ANESTHESIA:  General.  SURGEON:  Servando Salina, MD  ASSISTANT:  None.  PROCEDURE:  Under adequate general anesthesia, the patient was placed in a dorsal lithotomy position.  She was sterilely prepped and draped in usual fashion.  Bladder was catheterized for large amount of urine. Examination under anesthesia revealed a small uterus retroflexed.  No adnexal masses could be appreciated.  A Pederson speculum was placed in the vagina.  A single-tooth tenaculum was placed on the anterior lip of the cervix.  The cervix was serially dilated up to #25 [facility] dilator.  A diagnostic hysteroscope was inserted.  Polypoid lesions were noted on the left wall.  The hysteroscope was removed.  The cavity was then gently curetted, was irregular and decision was made to use the resectoscope.  The cervix was then carefully dilated up to #29 [facility] dilator, which took a little bit of time to get the resectoscope into the cavity.  Once that was done, however, the polypoid lesion was then resected.  A  360 degree looking at the uterine cavity showed no other lesions.  The resectoscope was removed.  The cavity was then curetted. All instruments were then removed from the vagina.  SPECIMEN:  Endometrial curettings and  Endometrial polyps sent to pathology.  ESTIMATED BLOOD LOSS:  Minimal.  FLUID DEFICIT:  20  mL.  COMPLICATION:  None.  The patient tolerated the procedure well, was transferred to recovery room in stable condition.     Servando Salina, M.D.     [place]/MEDQ  D:  06/10/2011  T:  06/11/2011  Job:  [id number]

## 2011-06-24 ENCOUNTER — Other Ambulatory Visit: Payer: Self-pay | Admitting: Internal Medicine

## 2011-08-18 ENCOUNTER — Telehealth: Payer: Self-pay | Admitting: Internal Medicine

## 2011-08-18 DIAGNOSIS — K529 Noninfective gastroenteritis and colitis, unspecified: Secondary | ICD-10-CM

## 2011-08-18 NOTE — Telephone Encounter (Signed)
Patient had CBC, sed rate, iron studies and Cmet on 06/07/11. Do you want the same labs repeated now? Please, advise.

## 2011-08-18 NOTE — Telephone Encounter (Signed)
Last labs 06/07/2011, 2 months ago, were all normal. I suggest that she does not need all the labs again so soon. 3 months will be 09/05/2011. Please repeat all labs then, unless she is having flare up of her Crohn's disease.

## 2011-08-19 NOTE — Telephone Encounter (Signed)
Left a message for patient that labs are due on 09/05/11 and I will fax an order. Instructed her to call us if she is having flare up.

## 2011-08-30 ENCOUNTER — Other Ambulatory Visit: Payer: Self-pay | Admitting: Internal Medicine

## 2011-08-31 LAB — CBC WITH DIFFERENTIAL/PLATELET
Basophils Absolute: 0 10*3/uL (ref 0.0–0.1)
Lymphocytes Relative: 23 % (ref 12–46)
Lymphs Abs: 0.9 10*3/uL (ref 0.7–4.0)
MCV: 91.8 fL (ref 78.0–100.0)
Neutro Abs: 2.8 10*3/uL (ref 1.7–7.7)
Neutrophils Relative %: 68 % (ref 43–77)
Platelets: 216 10*3/uL (ref 150–400)
RBC: 4.37 MIL/uL (ref 3.87–5.11)
WBC: 4.1 10*3/uL (ref 4.0–10.5)

## 2011-08-31 LAB — COMPREHENSIVE METABOLIC PANEL
ALT: 9 U/L (ref 0–35)
AST: 16 U/L (ref 0–37)
Albumin: 4.4 g/dL (ref 3.5–5.2)
Alkaline Phosphatase: 47 U/L (ref 39–117)
Calcium: 9.3 mg/dL (ref 8.4–10.5)
Chloride: 106 mEq/L (ref 96–112)
Potassium: 4.5 mEq/L (ref 3.5–5.3)
Sodium: 140 mEq/L (ref 135–145)

## 2011-08-31 LAB — IRON AND TIBC
%SAT: 16 % — ABNORMAL LOW (ref 20–55)
TIBC: 389 ug/dL (ref 250–470)
UIBC: 325 ug/dL (ref 125–400)

## 2011-08-31 LAB — SEDIMENTATION RATE: Sed Rate: 7 mm/hr (ref 0–22)

## 2011-09-07 ENCOUNTER — Encounter: Payer: Self-pay | Admitting: *Deleted

## 2011-09-13 ENCOUNTER — Telehealth: Payer: Self-pay | Admitting: Internal Medicine

## 2011-09-13 MED ORDER — DEXLANSOPRAZOLE 60 MG PO CPDR: 60.0000 mg | DELAYED_RELEASE_CAPSULE | Freq: Every day | ORAL | Status: DC

## 2011-09-13 NOTE — Telephone Encounter (Signed)
Please refill Dexilant

## 2011-09-13 NOTE — Telephone Encounter (Signed)
Dr Olevia Perches- Patient requests rx for Dexilant be sent to her pharmacy. It appears that we have not given her a PPI in quite sometime. Are you okay with me going ahead and sending [place] to her pharmacy?

## 2011-10-21 ENCOUNTER — Encounter: Payer: Self-pay | Admitting: Internal Medicine

## 2011-10-21 ENCOUNTER — Ambulatory Visit (INDEPENDENT_AMBULATORY_CARE_PROVIDER_SITE_OTHER): Payer: Commercial Managed Care - PPO | Admitting: Internal Medicine

## 2011-10-21 VITALS — BP 86/58 | HR 78 | Ht 67.0 in | Wt 179.0 lb

## 2011-10-21 DIAGNOSIS — Z79899 Other long term (current) drug therapy: Secondary | ICD-10-CM

## 2011-10-21 DIAGNOSIS — K219 Gastro-esophageal reflux disease without esophagitis: Secondary | ICD-10-CM

## 2011-10-21 DIAGNOSIS — K5090 Crohn's disease, unspecified, without complications: Secondary | ICD-10-CM

## 2011-10-21 DIAGNOSIS — D849 Immunodeficiency, unspecified: Secondary | ICD-10-CM

## 2011-10-21 NOTE — Patient Instructions (Addendum)
You have been scheduled for a Barium Esophogram and Upper GI at [facility] on 10/27/11 at 10:00 am. Please arrive 15 minutes prior to your appointment for registration. Make certain not to have anything to eat or drink 6 hours prior to your test. Please have labwork completed including the following: Celiac 10 Panel, H pylori  CC: Dr Willey Blade

## 2011-10-21 NOTE — Progress Notes (Signed)
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 She is here today because of increased gastroesophageal reflux. Over the last several months, she has developed odynophagia and burning in her throat despite being on Prevacid 15 mg twice a day. We have switched her to Dexilant 60 mg daily which resulted in marked improvement of her symptoms but she is still having breakthrough symptoms which occur mostly at the end of the day and she has burning in her throat and food backing up into her esophagus. She had a gastric emptying scan many years ago while being evaluated for inflammatory bowel disease in [place], [place]. and it was a normal study. She is not on any pain medications or psychotropic medications that could delay gastric emptying. She is on  maintenance therapy for Crohn's colitis which has been quite stable using 6-MP 50 mg Monday through Thursday and 25 mg Saturday and Sunday. She is also on Canasa suppositories 5 times a week, MiraLax, prunes, iron supplements and Colazal 750 mg 3 times Mon- Fri, and 2 times a day on Saturday and Sunday. Her last colonoscopy in June 2011 showed active colitis with cryptitis in the lamina propria. 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 She has a family history of gastroesophageal reflux in her brother and mother.   Past Medical History  Diagnosis Date  . GERD (gastroesophageal reflux disease)   . Eczema   . Hyperlipidemia   . Crohn disease   . Premenstrual tension syndromes   . Uterine polyp   . Shin splint   . Bone spur    Past Surgical History  Procedure Date  . Tonsillectomy   . Dilation and curettage of uterus 2009, 2013    reports that she has never smoked. She has never used smokeless tobacco. She reports that she does not drink alcohol or use illicit drugs. family history includes Asthma in  her mother; Diabetes in some unspecified family members; Heart disease in her maternal uncle and paternal grandfather; Kidney disease in her maternal grandmother; Prostate cancer in an unspecified family member; and Testicular cancer in her father.  There is no history of Colon cancer. No Known Allergies      Review of Systems:  The remainder of the 10 point ROS is negative except as outlined in H&P   Physical Exam: General appearance  Well developed, in no distress. Eyes- non icteric. HEENT nontraumatic, normocephalic. Mouth no lesions, tongue papillated, no cheilosis. Neck supple without adenopathy, thyroid not enlarged, no carotid bruits, no JVD. Lungs Clear to auscultation bilaterally. Cor normal S1, normal S2, regular rhythm, no murmur,  quiet precordium. Abdomen: Soft nontender with normoactive bowel sounds. No distention. Rectal: Not done Extremities no pedal edema. Skin no lesions. Neurological alert and oriented x 3. Psychological normal mood and affect.  Assessment and Plan:  Problem #1 New onset gastroesophageal reflux which was previously controlled on Prevacid 15 mg twice a day. She is improved on Dexilant 60 mg in the morning. She will add ranitidine 150 mg at bedtime. We have discussed possibly giving her Reglan but will hold it for now because of possible CNS side effects. He have discussed the possibility of delayed gastric emptying and reducing the size of her meals at the end of the day. We will proceed with a barium esophagram and upper GI series to assess her esophageal motility and gastric emptying  as well as gastroesophageal reflux. She prefers to do this instead of an upper endoscopy and I agree with that approach. We will also check her H. pylori antibody and sprue profile.  10/21/2011 Delfin Edis

## 2011-10-25 ENCOUNTER — Telehealth: Payer: Self-pay | Admitting: *Deleted

## 2011-10-25 DIAGNOSIS — E611 Iron deficiency: Secondary | ICD-10-CM

## 2011-10-25 NOTE — Telephone Encounter (Signed)
Message copied by Hulan Saas on Tue Oct 25, 2011 10:11 AM ------      Message from: Hulan Saas      Created: Wed Sep 07, 2011  1:34 PM       Needs CBC iron checked in 8 weeks(DB) fax order to patient.

## 2011-10-25 NOTE — Telephone Encounter (Signed)
Called and left a message for patient that her CBC and iron panel are due. Faxed the order to her office at [phone].

## 2011-10-26 ENCOUNTER — Telehealth: Payer: Self-pay | Admitting: *Deleted

## 2011-10-26 NOTE — Telephone Encounter (Signed)
Noted. We have received the results and they have been placed on Dr Nichola Sizer desk for her review.

## 2011-10-26 NOTE — Telephone Encounter (Signed)
Patient calling to let us know she will have her iron studies done and faxed. She had CBC with records she brought to [place]. She also wanted to let us know she had to move the upper GI appointment to 11/17/11.

## 2011-11-16 ENCOUNTER — Telehealth: Payer: Self-pay | Admitting: Internal Medicine

## 2011-11-16 NOTE — Telephone Encounter (Signed)
Pt called and cancelled her appt for upper GI for tomorrow because she is constipated. Dr. Olevia Perches notified.

## 2011-11-16 NOTE — Telephone Encounter (Signed)
I have left a message for the pt  Acknowledging her cancellation. Will discuss at the next appointment

## 2012-01-13 ENCOUNTER — Other Ambulatory Visit: Payer: Self-pay | Admitting: Internal Medicine

## 2012-01-16 ENCOUNTER — Other Ambulatory Visit: Payer: Self-pay | Admitting: Internal Medicine

## 2012-02-07 ENCOUNTER — Telehealth: Payer: Self-pay | Admitting: Internal Medicine

## 2012-02-07 NOTE — Telephone Encounter (Signed)
Please increase Colazal to 750 mg tid every day, increase 6MP to 75 mg daily every day of the week. Prednisone 66m , #100, if not improved sson, start 30 mg po daily x 1 week,25 x1 week, 20x 1 week 15 x1 week,10 x 1 week, 5 x 1 week.Call with status update in 1 week.

## 2012-02-07 NOTE — Telephone Encounter (Signed)
Patient calling to report a Crohn's flare due to stress. States she has increased her 6 MP to a full tablet daily and has done Mesalamine enemas x 2 days(during menses) and will be switching back to Canasa suppositories tonight. She is having LLQ pain. Denies blood in stool. She is requesting Prednisone rx to prevent her from a "full blown flare." Please, advise

## 2012-02-08 ENCOUNTER — Other Ambulatory Visit: Payer: Self-pay | Admitting: *Deleted

## 2012-02-08 ENCOUNTER — Telehealth: Payer: Self-pay | Admitting: *Deleted

## 2012-02-08 MED ORDER — MERCAPTOPURINE 50 MG PO TABS: 75.0000 mg | ORAL_TABLET | Freq: Every day | ORAL | Status: DC

## 2012-02-08 MED ORDER — BALSALAZIDE DISODIUM 750 MG PO CAPS: 750.0000 mg | ORAL_CAPSULE | Freq: Three times a day (TID) | ORAL | Status: DC

## 2012-02-08 MED ORDER — PREDNISONE 10 MG PO TABS: ORAL_TABLET | ORAL | Status: DC

## 2012-02-08 MED ORDER — BALSALAZIDE DISODIUM 750 MG PO CAPS: 2250.0000 mg | ORAL_CAPSULE | Freq: Three times a day (TID) | ORAL | Status: DC

## 2012-02-08 NOTE — Telephone Encounter (Signed)
Rx sent to pharmacy. Left a message for patient to call me. 

## 2012-02-08 NOTE — Telephone Encounter (Signed)
Corrected rx sent to pharmacy.

## 2012-02-08 NOTE — Telephone Encounter (Signed)
Dr Olevia Perches- I received a note from patient's pharmacy. Yesterday, patient was sent Colozal 750 mg, 1 tablet tid. However, you have had her on Colozal 750, 3 tablets tid since at least 2010 from what I can tell. Did you mean for her to continue take 3 tabs tid or did you only want her on 1 tab tid?

## 2012-02-08 NOTE — Telephone Encounter (Signed)
3 tabs tid is correct.sorry DB

## 2012-02-09 NOTE — Telephone Encounter (Signed)
Spoke with patient and gave her Dr. Nichola Sizer recommendations. She will call back in one week with update.

## 2012-03-12 ENCOUNTER — Other Ambulatory Visit: Payer: Self-pay | Admitting: Internal Medicine

## 2012-03-23 ENCOUNTER — Telehealth: Payer: Self-pay | Admitting: Internal Medicine

## 2012-03-23 DIAGNOSIS — K529 Noninfective gastroenteritis and colitis, unspecified: Secondary | ICD-10-CM

## 2012-03-23 DIAGNOSIS — K219 Gastro-esophageal reflux disease without esophagitis: Secondary | ICD-10-CM

## 2012-03-23 NOTE — Telephone Encounter (Signed)
Patient thinks she is due for lab work. Please, advise.

## 2012-03-23 NOTE — Telephone Encounter (Signed)
She never had the sprue profile, and H.Pylori antibody ordered in June 2013.please order them and also CBC,FE,TIBC,TSH,B12,,c-met,sed.rate.

## 2012-03-26 ENCOUNTER — Other Ambulatory Visit: Payer: Self-pay | Admitting: *Deleted

## 2012-03-26 DIAGNOSIS — K529 Noninfective gastroenteritis and colitis, unspecified: Secondary | ICD-10-CM

## 2012-03-26 DIAGNOSIS — K219 Gastro-esophageal reflux disease without esophagitis: Secondary | ICD-10-CM

## 2012-03-26 NOTE — Telephone Encounter (Signed)
Lab order faxed to patient's office. Left a message for patient that she is due for labs and they request has been faxed.

## 2012-04-12 ENCOUNTER — Telehealth: Payer: Self-pay | Admitting: Internal Medicine

## 2012-04-12 NOTE — Telephone Encounter (Signed)
Left message for patient to call back  

## 2012-04-13 ENCOUNTER — Telehealth: Payer: Self-pay | Admitting: Internal Medicine

## 2012-04-13 NOTE — Telephone Encounter (Signed)
Spoke with patient and told her we were waiting for her lab results prior to filling the Mesalamine. She states she has not gotten the labs done yet because she thought she had already had H.pylori done. Explained to patient we do not have the results of H. Pylori and if she will send them to me she does not have to repeat it. She will get labs done.

## 2012-04-13 NOTE — Telephone Encounter (Signed)
I am not sure what other code. OK to cancel TSH,B12, her PCP may order it  When she sees her.

## 2012-04-13 NOTE — Telephone Encounter (Signed)
Patient's insurance will not cover TSH or B12 level with dx codes 558.9, 530.81. She is asking if there are any other codes to try. If not she, wants to cancel the labs. Please, advise.

## 2012-04-16 NOTE — Telephone Encounter (Signed)
Left a message for patient with Dr. Nichola Sizer recommendation.

## 2012-04-18 ENCOUNTER — Other Ambulatory Visit: Payer: Self-pay | Admitting: *Deleted

## 2012-04-18 MED ORDER — MESALAMINE 4 G RE ENEM: 4.0000 g | ENEMA | Freq: Every day | RECTAL | Status: DC

## 2012-06-04 ENCOUNTER — Other Ambulatory Visit: Payer: Self-pay | Admitting: *Deleted

## 2012-06-04 MED ORDER — MESALAMINE 1000 MG RE SUPP: 1000.0000 mg | Freq: Every day | RECTAL | Status: DC

## 2012-06-11 ENCOUNTER — Other Ambulatory Visit: Payer: Self-pay | Admitting: Internal Medicine

## 2012-07-09 ENCOUNTER — Telehealth: Payer: Self-pay | Admitting: Internal Medicine

## 2012-07-09 NOTE — Telephone Encounter (Signed)
Left a message for patient to call me. 

## 2012-07-10 NOTE — Telephone Encounter (Signed)
I left her a message that I will see her that Friday- I can see her either before 1.30 pm or at the end of the schedult around 4.00 pm.

## 2012-07-10 NOTE — Telephone Encounter (Signed)
Patient calling to report a 15 lb. Weight loss in 3 months. She has not changed any eating habits and cannot explain the weight loss. She is asking if she needs to have a colonoscopy. She is off on 07/20/12 if she needs to be seen. Please, advise.

## 2012-07-10 NOTE — Telephone Encounter (Signed)
Patient left a message to call her at [phone]. Left a message for patient to call me.

## 2012-07-10 NOTE — Telephone Encounter (Signed)
Left a message for patient to call me to schedule OV on Friday.

## 2012-07-11 NOTE — Telephone Encounter (Signed)
Spoke with patient and scheduled 1:00/1:15 PM on 07/20/12 as per Dr. Olevia Perches.

## 2012-07-16 ENCOUNTER — Encounter: Payer: Self-pay | Admitting: Internal Medicine

## 2012-07-20 ENCOUNTER — Ambulatory Visit (INDEPENDENT_AMBULATORY_CARE_PROVIDER_SITE_OTHER): Payer: Commercial Managed Care - PPO | Admitting: Internal Medicine

## 2012-07-20 ENCOUNTER — Encounter: Payer: Self-pay | Admitting: Internal Medicine

## 2012-07-20 VITALS — BP 92/60 | HR 88 | Ht 66.75 in | Wt 168.5 lb

## 2012-07-20 DIAGNOSIS — R634 Abnormal weight loss: Secondary | ICD-10-CM

## 2012-07-20 DIAGNOSIS — Z79899 Other long term (current) drug therapy: Secondary | ICD-10-CM

## 2012-07-20 DIAGNOSIS — D849 Immunodeficiency, unspecified: Secondary | ICD-10-CM

## 2012-07-20 DIAGNOSIS — K5090 Crohn's disease, unspecified, without complications: Secondary | ICD-10-CM

## 2012-07-20 NOTE — Patient Instructions (Addendum)
Your physician has requested that you have the following lab work: CBC, CMET, Sed Rate, TSH, B12 level, Thiopurine Metabolites  CC: Dr Willey Blade

## 2012-07-20 NOTE — Progress Notes (Signed)
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 Her last office visit was in June 2013. She is now here with a 10 pound weight loss. Her weight in June 2013 was 179 pounds. Today, it is 169 pounds. She denies any abdominal pain, diarrhea, rectal bleeding, nausea or vomiting. She works out about 3 times a week and she has adjusted her diet to minimize gastroesophageal reflux. She continues on 6-MP 50 mg daily, Colazal 3 tablets 3 times a day, Dexilant  60 mg daily, Canasa suppositories 5 times a week and Prozac 20 mg daily. Her last colonoscopy in June 2011 showed active colitis from 0-20 cm.  Allergies  Allergen Reactions  . Ciprofloxacin Nausea And Vomiting     Past Medical History  Diagnosis Date  . GERD (gastroesophageal reflux disease)   . Eczema   . Hyperlipidemia   . Crohn disease   . Premenstrual tension syndromes   . Uterine polyp   . Shin splint   . Bone spur    Past Surgical History  Procedure Laterality Date  . Tonsillectomy    . Dilation and curettage of uterus  2009, 2013    reports that she has never smoked. She has never used smokeless tobacco. She reports that she does not drink alcohol or use illicit drugs. family history includes Asthma in her mother; Diabetes in some unspecified family members; Heart disease in her maternal uncle and paternal grandfather; Kidney disease in her maternal grandmother; Prostate cancer in an unspecified family member; and Testicular cancer in her father.  There is no history of Colon cancer. Allergies  Allergen Reactions  . Ciprofloxacin Nausea And Vomiting        Review of Systems: Denies heartburn dysphagia fever chest pain  The remainder of the 10 point ROS is negative except as outlined in H&P   Physical Exam: General appearance  Well developed, in no distress. Eyes- non icteric. HEENT nontraumatic,  normocephalic. Mouth no lesions, tongue papillated, no cheilosis. Neck supple without adenopathy, thyroid not enlarged, no carotid bruits, no JVD. Lungs Clear to auscultation bilaterally. Cor normal S1, normal S2, regular rhythm, no murmur,  quiet precordium. Abdomen: Soft nontender with normoactive bowel sounds. She is ticklish. Rectal: Normal rectal sphincter tone. Normal perianal area. Hemoccult negative stool. Extremities no pedal edema. Skin no lesions. Neurological alert and oriented x 3. Psychological normal mood and affect.  Assessment and Plan:  Problem #1 Crohn's colitis of 15 years duration, currently in remission. Her last flareup was in the fall of 2013. She responded to  rapid prednisone taper and rowasa enemas. Weight loss is unexplained. I think it is because of modification of her eating habits and increased exercise. We will obtain baseline blood tests including TSH and B12 levels, CBC, CMET, thoipurine metabolites and sed rate. A recall colonoscopy will be due in June 2016. 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

## 2012-08-01 ENCOUNTER — Other Ambulatory Visit: Payer: Self-pay | Admitting: *Deleted

## 2012-08-01 ENCOUNTER — Telehealth: Payer: Self-pay | Admitting: Internal Medicine

## 2012-08-01 DIAGNOSIS — K5010 Crohn's disease of large intestine without complications: Secondary | ICD-10-CM

## 2012-08-01 NOTE — Telephone Encounter (Signed)
Patient is asking for T4 to be added to her lab work. Is this okay?

## 2012-08-21 ENCOUNTER — Encounter: Payer: Self-pay | Admitting: Internal Medicine

## 2012-09-04 ENCOUNTER — Other Ambulatory Visit: Payer: Self-pay | Admitting: Internal Medicine

## 2012-10-02 ENCOUNTER — Other Ambulatory Visit: Payer: Self-pay | Admitting: Internal Medicine

## 2012-10-03 NOTE — Telephone Encounter (Signed)
Ordered in pc

## 2012-11-29 ENCOUNTER — Other Ambulatory Visit: Payer: Self-pay | Admitting: Internal Medicine

## 2012-12-13 ENCOUNTER — Other Ambulatory Visit: Payer: Self-pay | Admitting: Internal Medicine

## 2012-12-14 ENCOUNTER — Telehealth: Payer: Self-pay | Admitting: *Deleted

## 2012-12-14 MED ORDER — MERCAPTOPURINE 50 MG PO TABS: 50.0000 mg | ORAL_TABLET | Freq: Every day | ORAL | Status: DC

## 2012-12-14 NOTE — Telephone Encounter (Signed)
Message copied by Larina Bras on Fri Dec 14, 2012 11:42 AM ------      Message from: Lafayette Dragon      Created: Thu Dec 13, 2012  5:25 PM       sahe is due for blood tests in 6 months from last ones in 07/2012 , that is in September 2014. She will need the same blood test she had in 3.2014. 6MP dose down to 92m/day due to low WBC. Please refill.      ----- Message -----         From: DLarina Bras CMA         Sent: 12/13/2012  10:35 AM           To: DLafayette Dragon MD            Dr BOlevia Perches      Patient requests refills on 6MP. However, per 02/07/12 phone note, patient was to increase 6MP dosage to 75 mg daily. Per your last office note 07/20/12, patient told uKoreashe was taking only 50 mg daily. Her last prescription was sent on 02/08/12 for #90 with 1 refill. So, if she was taking 1 tablet daily, she should have been out in April. Would you like me to refill rx or does she need labwork &/or office visit?       ------

## 2013-01-08 ENCOUNTER — Telehealth: Payer: Self-pay | Admitting: *Deleted

## 2013-01-08 DIAGNOSIS — K5090 Crohn's disease, unspecified, without complications: Secondary | ICD-10-CM

## 2013-01-08 NOTE — Telephone Encounter (Signed)
Left message requesting that patient call back. She is due for labwork (thiopurine metabolites, tsh, b12, sed, cmet, cbc). She needs to call and confirm if she would like orders faxed to [phone] to be done in her lab or if she would like to get testing completed here.

## 2013-01-10 NOTE — Telephone Encounter (Signed)
Patient called back. She had thiopurine metabolites completed at [facility] last month. We originally did not receive these results, however, we have now gotten them after calling to request them. Patient also states that she had other blood tests with Dr Willey Blade. I have requested those labs as well Dr Olevia Perches can decide what labs if any she needs now.

## 2013-01-11 ENCOUNTER — Telehealth: Payer: Self-pay | Admitting: Internal Medicine

## 2013-01-11 NOTE — Telephone Encounter (Signed)
I have contacted Dr Roland Earl office. They are unable to find completed labwork. Dr Ishmael Holter states that she had them completed at work and [facility] should have results if Dr Roland Earl office is unable to locate them. I have spoken to [facility] and they will send labwork over. Sed Rate order will be sent to Dr Ishmael Holter office though since this was not drawn (fax [phone]). Dr Ishmael Holter verbalizes understanding.

## 2013-01-11 NOTE — Telephone Encounter (Signed)
Rec'd from [place] forward 2 pages to Dr. Thereasa Solo

## 2013-01-24 ENCOUNTER — Encounter: Payer: Self-pay | Admitting: Internal Medicine

## 2013-01-25 ENCOUNTER — Encounter: Payer: Self-pay | Admitting: Internal Medicine

## 2013-02-28 ENCOUNTER — Other Ambulatory Visit: Payer: Self-pay | Admitting: Internal Medicine

## 2013-03-20 ENCOUNTER — Other Ambulatory Visit: Payer: Self-pay | Admitting: Internal Medicine

## 2013-05-29 ENCOUNTER — Other Ambulatory Visit: Payer: Self-pay | Admitting: Internal Medicine

## 2013-07-01 ENCOUNTER — Other Ambulatory Visit: Payer: Self-pay | Admitting: Internal Medicine

## 2013-07-03 ENCOUNTER — Telehealth: Payer: Self-pay | Admitting: Internal Medicine

## 2013-07-03 DIAGNOSIS — K5090 Crohn's disease, unspecified, without complications: Secondary | ICD-10-CM

## 2013-07-03 MED ORDER — MERCAPTOPURINE 50 MG PO TABS: ORAL_TABLET | ORAL | Status: DC

## 2013-07-03 MED ORDER — BALSALAZIDE DISODIUM 750 MG PO CAPS: ORAL_CAPSULE | ORAL | Status: DC

## 2013-07-03 MED ORDER — MESALAMINE 1000 MG RE SUPP: RECTAL | Status: DC

## 2013-07-03 NOTE — Telephone Encounter (Signed)
-  please obtain blood tests before her visit: CBC, C-met, sed.rate, TSH, B12, Iron studies. Last blood work was in JUly 2014

## 2013-07-03 NOTE — Telephone Encounter (Signed)
Faxed lab requests to [place]. Attention Dr.Isakson [phone]) Left a message for patient that lab orders have been faxed here.

## 2013-07-03 NOTE — Telephone Encounter (Signed)
Rx refills sent. Patient is asking if she needs to have labs drawn. She is going to schedule an OV mid March. Please, advise.

## 2013-07-08 ENCOUNTER — Telehealth: Payer: Self-pay | Admitting: Internal Medicine

## 2013-07-08 NOTE — Telephone Encounter (Signed)
Refaxed lab orders to number provided. Left a message for patient that this has been done.

## 2013-07-11 ENCOUNTER — Telehealth: Payer: Self-pay | Admitting: Internal Medicine

## 2013-07-11 NOTE — Telephone Encounter (Signed)
Refaxed orders to number provided. Left a message for patient that I have done this.

## 2013-07-25 ENCOUNTER — Encounter: Payer: Self-pay | Admitting: Physician Assistant

## 2013-07-25 ENCOUNTER — Ambulatory Visit (INDEPENDENT_AMBULATORY_CARE_PROVIDER_SITE_OTHER): Payer: Commercial Managed Care - PPO | Admitting: Physician Assistant

## 2013-07-25 VITALS — BP 108/68 | HR 72 | Ht 66.75 in | Wt 156.4 lb

## 2013-07-25 DIAGNOSIS — K5090 Crohn's disease, unspecified, without complications: Secondary | ICD-10-CM

## 2013-07-25 DIAGNOSIS — K5010 Crohn's disease of large intestine without complications: Secondary | ICD-10-CM

## 2013-07-25 MED ORDER — HYDROCORTISONE ACE-PRAMOXINE 2.5-1 % RE CREA
TOPICAL_CREAM | Freq: Three times a day (TID) | RECTAL | Status: DC
Start: 2013-07-25 — End: 2015-05-14

## 2013-07-25 MED ORDER — BALSALAZIDE DISODIUM 750 MG PO CAPS: ORAL_CAPSULE | ORAL | Status: DC

## 2013-07-25 MED ORDER — MESALAMINE 1000 MG RE SUPP: 1000.0000 mg | Freq: Every day | RECTAL | Status: DC

## 2013-07-25 MED ORDER — DEXLANSOPRAZOLE 60 MG PO CPDR: 60.0000 mg | DELAYED_RELEASE_CAPSULE | Freq: Every day | ORAL | Status: DC

## 2013-07-25 MED ORDER — MERCAPTOPURINE 50 MG PO TABS: ORAL_TABLET | ORAL | Status: DC

## 2013-07-25 NOTE — Patient Instructions (Signed)
We sent 90 day refills on prescriptions to [facility] [place]. 1. Colazal 2. Dexilant 3. Analpram 4. Mercaptipurine 5. Canasa Suppositories  Follow up with Dr. Olevia Perches in 1 year. Please call if you need to see [place] sooner.

## 2013-07-25 NOTE — Progress Notes (Signed)
Subjective:    Patient ID: Meagan Hurley, female    DOB: 1970/08/02, 43 y.o.   MRN: [id number]  HPI  Meagan Hurley is a very nice 43 year old African American female known to Dr. Delfin Edis with long history of Crohn's colitis which was initially diagnosed in 1997. Her last colonoscopy was done in June of 2011 which showed active colitis from 0-20 cm. She has been on a regimen of 6-MP and Colazal with good control of her disease. She comes in today for yearly followup and refills on her meds. She says she's been doing very well and has not had any recent exacerbations. She currently has no complaints of abdominal pain diarrhea nausea or hematochezia. She says she does use Canasa suppositories generally Monday through Thursday 's which helps can keep rectal bleeding in check.  She had recent labs done at her primary care physician on 07/18/2013 she brings copies today- electrolytes are normal liver function studies also normal iron 151 transferrin 275 TIBC 386, B12 level 515,. WBC 2.9 hemoglobin 13 hematocrit of 40 MCV 89 platelets 186.  Review of Systems  Constitutional: Negative.   HENT: Negative.   Eyes: Negative.   Respiratory: Negative.   Cardiovascular: Negative.   Gastrointestinal: Negative.   Endocrine: Negative.   Genitourinary: Negative.   Musculoskeletal: Negative.   Allergic/Immunologic: Negative.   Neurological: Negative.   Hematological: Negative.   Psychiatric/Behavioral: Negative.    Outpatient Encounter Prescriptions as of 07/25/2013  Medication Sig  . acetaminophen (TYLENOL) 500 MG tablet Take 500 mg by mouth as needed.    . balsalazide (COLAZAL) 750 MG capsule TAKE 3 CAPSULES BY MOUTH THREE TIMES A DAY  . calcium carbonate (TUMS EX) 750 MG chewable tablet Chew 2 tablets by mouth daily.  . Calcium-Vitamin D 600-200 MG-UNIT per tablet Take 1 tablet by mouth daily.  . Chaste Tree (VITEX EXTRACT PO) Take 2 capsules by mouth 2 (two) times daily. Takes BID 2 weeks out of the  month  . dexlansoprazole (DEXILANT) 60 MG capsule Take 1 capsule (60 mg total) by mouth daily.  . diclofenac sodium (VOLTAREN) 1 % GEL Apply topically 4 (four) times daily as needed.   . ferrous sulfate 325 (65 FE) MG tablet Take 325 mg by mouth every Monday, Wednesday, and Friday.   . Fiber CHEW Chew 2 tablets by mouth. .  . Flaxseed, Linseed, (FLAX SEEDS PO) Take 1-2 scoop by mouth daily.  Marland Kitchen FLUoxetine (PROZAC) 20 MG tablet Take 20 mg by mouth daily.  . fluticasone (FLONASE) 50 MCG/ACT nasal spray Place 1-2 sprays into the nose daily as needed.   . hydrocortisone-pramoxine (ANALPRAM-HC) 2.5-1 % rectal cream Place rectally 3 (three) times daily.  Marland Kitchen loratadine-pseudoephedrine (CLARITIN-D 24-HOUR) 10-240 MG per 24 hr tablet Take 1 tablet by mouth daily.  . mercaptopurine (PURINETHOL) 50 MG tablet TAKE 1 TABLET BY MOUTH DAILY. GIVE ON AN EMPTY STOMACH 1 HOUR BEFORE OR 2 HOURS AFTER MEALS. CAUTION: CHEMOTHERAPY.  . mometasone (ELOCON) 0.1 % cream Apply 1 application topically 4 (four) times a week. Takes Monday-thursday  . Multiple Vitamin (MULTIVITAMIN) tablet Take 1 tablet by mouth daily.    . OMEGA 3 1200 MG CAPS Take 2,400 mg by mouth daily.  . polyethylene glycol powder (GLYCOLAX/MIRALAX) powder Take 1 capful dissolved in water once daily as needed   . [DISCONTINUED] balsalazide (COLAZAL) 750 MG capsule TAKE 3 CAPSULES BY MOUTH THREE TIMES A DAY  . [DISCONTINUED] DEXILANT 60 MG capsule TAKE ONE CAPSULE BY MOUTH DAILY  . [  DISCONTINUED] hydrocortisone-pramoxine (ANALPRAM-HC) 2.5-1 % rectal cream PLACE 1 APPLICATION RECTALLY 3 (THREE) TIMES DAILY AS NEEDED.  . [DISCONTINUED] mercaptopurine (PURINETHOL) 50 MG tablet TAKE 1 TABLET BY MOUTH DAILY. GIVE ON AN EMPTY STOMACH 1 HOUR BEFORE OR 2 HOURS AFTER MEALS. CAUTION: CHEMOTHERAPY.  . [DISCONTINUED] mesalamine (CANASA) 1000 MG suppository PLACE 1 SUPPOSITORY (1,000 MG TOTAL) RECTALLY AT BEDTIME.  . mesalamine (CANASA) 1000 MG suppository Place 1  suppository (1,000 mg total) rectally at bedtime.   Allergies  Allergen Reactions  . Ciprofloxacin Nausea And Vomiting        Patient Active Problem List   Diagnosis Date Noted  . ALLERGIC RHINITIS 06/05/2008  . GERD 06/05/2008  . PREMENSTRUAL DYSPHORIC SYNDROME 06/05/2008  . ECZEMA 06/05/2008  . LYMPHADENOPATHY, REACTIVE 06/05/2008  . HYPERCHOLESTEROLEMIA 05/07/2007  . CROHN'S DISEASE, LARGE INTESTINE 05/07/2007  . COLITIS 05/07/2007   History  Substance Use Topics  . Smoking status: Never Smoker   . Smokeless tobacco: Never Used  . Alcohol Use: No   family history includes Asthma in her mother; Diabetes in some other family members; Heart disease in her maternal uncle and paternal grandfather; Kidney disease in her maternal grandmother; Prostate cancer in an other family member; Testicular cancer in her father. There is no history of Colon cancer.  Objective:   Physical Exam  well-developed AA female in no acute distress, pleasant blood pressure 108/68 pulse 72 height 5 foot 6 weight 156. HEENT ;nontraumatic normocephalic EOMI PERRLA sclera anicteric, Supple; no JVD, Cardiovascular; regular rate and rhythm with S1-S2 no murmur or gallop, Pulmonary; clear bilaterally, Abdomen; soft nontender nondistended bowel sounds are active there is no palpable mass or hepatosplenomegaly bowel sounds are active, Rectal; exam not done, Extremities; no clubbing cyanosis or edema skin warm and dry, Psych ;mood and affect appropriate but        Assessment & Plan:  #51  43 year old [place] American female with long history of Crohn's colitis currently asymptomatic on regimen of 6-MP, Colazal and Canasa suppositories. Chronic GERD-well controlled on Dexilant 60 mg daily  Plan Will refill all of her medications x1 year. She will followup with Dr. Olevia Perches  in one year or sooner for any problems She will be due for followup colonoscopy next year as well.

## 2013-07-26 NOTE — Progress Notes (Signed)
Reviewed and agree. I am not sure why she could not wait to see me. I see her once a year so she couls have just called for regular appointment. Well.Meagan KitchenMarland KitchenMarland Hurley

## 2014-06-24 ENCOUNTER — Telehealth: Payer: Self-pay | Admitting: Internal Medicine

## 2014-06-24 NOTE — Telephone Encounter (Signed)
Spoke with patient and scheduled with Tye Savoy, NP on 06/26/14 at 1:30 PM per patient request.

## 2014-06-26 ENCOUNTER — Ambulatory Visit (INDEPENDENT_AMBULATORY_CARE_PROVIDER_SITE_OTHER): Payer: Commercial Managed Care - PPO | Admitting: Nurse Practitioner

## 2014-06-26 ENCOUNTER — Encounter: Payer: Self-pay | Admitting: Nurse Practitioner

## 2014-06-26 VITALS — BP 94/58 | HR 76 | Ht 66.75 in | Wt 164.1 lb

## 2014-06-26 DIAGNOSIS — K219 Gastro-esophageal reflux disease without esophagitis: Secondary | ICD-10-CM

## 2014-06-26 DIAGNOSIS — Z5181 Encounter for therapeutic drug level monitoring: Secondary | ICD-10-CM

## 2014-06-26 DIAGNOSIS — R1310 Dysphagia, unspecified: Secondary | ICD-10-CM

## 2014-06-26 DIAGNOSIS — K5010 Crohn's disease of large intestine without complications: Secondary | ICD-10-CM

## 2014-06-26 NOTE — Patient Instructions (Signed)
We have printed your lab work to have your blood work drawn at the lab of your choice.  You have been scheduled for an endoscopy and colonoscopy. Please follow the written instructions given to you at your visit today. Please pick up your prep at the pharmacy within the next 1-3 days. If you use inhalers (even only as needed), please bring them with you on the day of your procedure.

## 2014-06-27 ENCOUNTER — Encounter: Payer: Self-pay | Admitting: Nurse Practitioner

## 2014-06-27 NOTE — Progress Notes (Signed)
     History of Present Illness:   Patient is a 44 year old female known to Dr. Olevia Perches. She has a longstanding history of Crohn's colitis, maintained on Mesalamine suppositories and 6 MP. Her Crohn's has been very well controlled, she is due for follow up colonoscopy around April or May.   Patient is worked in today for evaluation of GERD symptoms. She was doing well on daily PPI + H2 blocker until a few months ago. Now having tingling in her mouth, right throat pain and intermittent solid food dysphagia. The right sided throat pain is intermittent and not just associated with swallowing. She reports tingling in her scalp, initially thought to be related to a certain shampoo, or possibly Claritin D but that wasn't the case. No tingling sensation in any other areas.  .   Current Medications, Allergies, Past Medical History, Past Surgical History, Family History and Social History were reviewed in [employer] record.   Physical Exam: General: Pleasant, well developed , black female in no acute distress Head: Normocephalic and atraumatic Eyes:  sclerae anicteric, conjunctiva pink  Ears: Normal auditory acuity Lungs: Clear throughout to auscultation Heart: Regular rate and rhythm Abdomen: Soft, non distended, non-tender. No masses, no hepatomegaly. Normal bowel sounds Musculoskeletal: Symmetrical with no gross deformities  Extremities: No edema  Neurological: Alert oriented x 4, grossly nonfocal Psychological:  Alert and cooperative. Normal mood and affect  Assessment and Recommendations:   45. 44 year old female with right throat discomfort, intermittent solid food dysphagia.  For further evaluation patient will be scheduled for EGD with possible dilation. The benefits, risks, and potential complications of EGD with possible biopsies and/or dilation were discussed with the patient and she agrees to proceed. We discussed the importance of eating slowly, taking small  bites of food, chewing well and consuming adequate amounts of fluid in between bites to avoid food impaction.   2. Tingling sensation involving mouth and scalp. Etiology unclear, doesn't sound GI in nature. Neuropathic? B12 deficiency unlikely.    3  Crohn's colitis, asymptomatic on mesalamine suppositories and 66m. She is due for routine labs. Orders put in computer, patient will have these drawn at her place of work. She is close to being due for surveillance colonoscopy. Will proceed with colonoscopy at time of EGD. The risks, benefits, and alternatives to colonoscopy with possible biopsy and possible polypectomy were discussed with the patient and she consents to proceed.

## 2014-06-29 DIAGNOSIS — K5010 Crohn's disease of large intestine without complications: Secondary | ICD-10-CM | POA: Insufficient documentation

## 2014-06-29 DIAGNOSIS — R1310 Dysphagia, unspecified: Secondary | ICD-10-CM | POA: Insufficient documentation

## 2014-06-29 NOTE — Progress Notes (Signed)
reviewed and agree.

## 2014-07-08 ENCOUNTER — Other Ambulatory Visit: Payer: Self-pay | Admitting: Physician Assistant

## 2014-07-30 ENCOUNTER — Other Ambulatory Visit: Payer: Self-pay | Admitting: Internal Medicine

## 2014-07-30 ENCOUNTER — Encounter: Payer: Self-pay | Admitting: Internal Medicine

## 2014-07-30 ENCOUNTER — Ambulatory Visit (AMBULATORY_SURGERY_CENTER): Payer: Commercial Managed Care - PPO | Admitting: Internal Medicine

## 2014-07-30 VITALS — BP 122/74 | HR 63 | Temp 97.0°F | Resp 23 | Ht 66.0 in | Wt 156.0 lb

## 2014-07-30 DIAGNOSIS — K2970 Gastritis, unspecified, without bleeding: Secondary | ICD-10-CM | POA: Diagnosis not present

## 2014-07-30 DIAGNOSIS — K2100 Gastro-esophageal reflux disease with esophagitis, without bleeding: Secondary | ICD-10-CM

## 2014-07-30 DIAGNOSIS — D122 Benign neoplasm of ascending colon: Secondary | ICD-10-CM

## 2014-07-30 DIAGNOSIS — K50919 Crohn's disease, unspecified, with unspecified complications: Secondary | ICD-10-CM

## 2014-07-30 DIAGNOSIS — K5010 Crohn's disease of large intestine without complications: Secondary | ICD-10-CM | POA: Diagnosis not present

## 2014-07-30 MED ORDER — SODIUM CHLORIDE 0.9 % IV SOLN: 500.0000 mL | INTRAVENOUS | Status: DC

## 2014-07-30 NOTE — Op Note (Signed)
[facility]  [address] [place], [id number]   COLONOSCOPY PROCEDURE REPORT  PATIENT: Meagan Hurley, Meagan Hurley  MR#: [id number] BIRTHDATE: 08-07-1970 , 57  yrs. old GENDER: female ENDOSCOPIST: Lafayette Dragon, MD REFERRED TD:SKAJGOTL Theodis Sato, M.D. PROCEDURE DATE:  07/30/2014 PROCEDURE:   Colonoscopy with biopsy and Colonoscopy with cold biopsy polypectomy First Screening Colonoscopy - Avg.  risk and is 50 yrs.  old or older - No.  Prior Negative Screening - Now for repeat screening. N/A  History of Adenoma - Now for follow-up colonoscopy & has been > or = to 3 yrs.  N/A ASA CLASS:   Class I INDICATIONS:Inflammatory bowel disease of the intestine if more precise diagnosis or determination of the extent / severity of activity of disease will influence immediate / future management.  MEDICATIONS: Monitored anesthesia care and Propofol 450 mg IV  DESCRIPTION OF PROCEDURE:   After the risks benefits and alternatives of the procedure were thoroughly explained, informed consent was obtained.  The digital rectal exam revealed no abnormalities of the rectum.   The LB [id number]  endoscope was introduced through the anus and advanced to the terminal ileum which was intubated for a short distance. No adverse events experienced.   The quality of the prep was good.  (MoviPrep was used)  The instrument was then slowly withdrawn as the colon was fully examined.      COLON FINDINGS: A sessile polyp measuring 3 mm in size was found in the ascending colon.  A polypectomy was performed with cold forceps.  The resection was complete, the polyp tissue was completely retrieved and sent to histology.  Multiple biopsies were performed using cold forceps.  Multiple biopsies obtained from normal appearing terminal ileum.  Ascending colon.  Descending colon from 30-70 cm and from rectosigmoid from 0-30 cm.  There was no evidence of active Crohn's disease.  Sample was obtained  and sent to histology.  Retroflexed views revealed no abnormalities. The time to cecum = 7.13 Withdrawal time = 12.15   The scope was withdrawn and the procedure completed. COMPLICATIONS: There were no immediate complications.  ENDOSCOPIC IMPRESSION: 1. Sessile polyp was found in the ascending colon 2.  polypectomy was performed with cold forceps; 3.no evidence of active Crohn's disease including terminal ileum   RECOMMENDATIONS: 1.  Await biopsy results 2.  Continue current medications.  Depending on the results of the biopsies we may try to reduce some of the anti-inflammatory medications  eSigned:  Lafayette Dragon, MD 07/30/2014 2:44 PM   cc:   PATIENT NAME:  Meagan Hurley, Meagan Hurley MR#: [id number]

## 2014-07-30 NOTE — Progress Notes (Signed)
Patient awakening,vss,report to rn 

## 2014-07-30 NOTE — Patient Instructions (Signed)
YOU HAD AN ENDOSCOPIC PROCEDURE TODAY AT [facility]:   Refer to the procedure report that was given to you for any specific questions about what was found during the examination.  If the procedure report does not answer your questions, please call your gastroenterologist to clarify.  If you requested that your care partner not be given the details of your procedure findings, then the procedure report has been included in a sealed envelope for you to review at your convenience later.  YOU SHOULD EXPECT: Some feelings of bloating in the abdomen. Passage of more gas than usual.  Walking can help get rid of the air that was put into your GI tract during the procedure and reduce the bloating. If you had a lower endoscopy (such as a colonoscopy or flexible sigmoidoscopy) you may notice spotting of blood in your stool or on the toilet paper. If you underwent a bowel prep for your procedure, you may not have a normal bowel movement for a few days.  Please Note:  You might notice some irritation and congestion in your nose or some drainage.  This is from the oxygen used during your procedure.  There is no need for concern and it should clear up in a day or so.  SYMPTOMS TO REPORT IMMEDIATELY:   Following lower endoscopy (colonoscopy or flexible sigmoidoscopy):  Excessive amounts of blood in the stool  Significant tenderness or worsening of abdominal pains  Swelling of the abdomen that is new, acute  Fever of 100F or higher   Following upper endoscopy (EGD)  Vomiting of blood or coffee ground material  New chest pain or pain under the shoulder blades  Painful or persistently difficult swallowing  New shortness of breath  Fever of 100F or higher  Black, tarry-looking stools  For urgent or emergent issues, a gastroenterologist can be reached at any hour by calling [phone].   DIET: Your first meal following the procedure should be a small meal and then it is ok to progress to  your normal diet. Heavy or fried foods are harder to digest and may make you feel nauseous or bloated.  Likewise, meals heavy in dairy and vegetables can increase bloating.  Drink plenty of fluids but you should avoid alcoholic beverages for 24 hours.  ACTIVITY:  You should plan to take it easy for the rest of today and you should NOT DRIVE or use heavy machinery until tomorrow (because of the sedation medicines used during the test).    FOLLOW UP: Our staff will call the number listed on your records the next business day following your procedure to check on you and address any questions or concerns that you may have regarding the information given to you following your procedure. If we do not reach you, we will leave a message.  However, if you are feeling well and you are not experiencing any problems, there is no need to return our call.  We will assume that you have returned to your regular daily activities without incident.  If any biopsies were taken you will be contacted by phone or by letter within the next 1-3 weeks.  Please call us at [phone] if you have not heard about the biopsies in 3 weeks.    SIGNATURES/CONFIDENTIALITY: You and/or your care partner have signed paperwork which will be entered into your electronic medical record.  These signatures attest to the fact that that the information above on your After Visit Summary has been reviewed  and is understood.  Full responsibility of the confidentiality of this discharge information lies with you and/or your care-partner.  Gastritis, anti-reflux regimen and polyp information given.

## 2014-07-30 NOTE — Progress Notes (Signed)
Called to room to assist during endoscopic procedure.  Patient ID and intended procedure confirmed with present staff. Received instructions for my participation in the procedure from the performing physician.  

## 2014-07-30 NOTE — Op Note (Signed)
[facility]  [address] [place], [address]   ENDOSCOPY PROCEDURE REPORT  PATIENT: Meagan, Hurley  MR#: [id number] BIRTHDATE: 1971-04-14 , 12  yrs. old GENDER: female ENDOSCOPIST: Lafayette Dragon, MD REFERRING MD: Dr Idamae Schuller PROCEDURE DATE:  07/30/2014 PROCEDURE:  EGD w/ biopsy ASA CLASS:     Class I INDICATIONS:  heartburn and therapy of reflux esophagitis. MEDICATIONS: Monitored anesthesia care and Propofol 200 mg IV TOPICAL ANESTHETIC: none  DESCRIPTION OF PROCEDURE: After the risks benefits and alternatives of the procedure were thoroughly explained, informed consent was obtained.  The LB [id number] endoscope was introduced through the mouth and advanced to the second portion of the duodenum , Without limitations.  The instrument was slowly withdrawn as the mucosa was fully examined.    Esophagus: esophagus was intubated without difficulty. Esophageal mucosa appeared normal throughout the proximal, mid and distal esophagus there was no evidence of acute esophagitis. Squamocolumnar junction was normal biopsies were obtained to rule out eosinophilic esophagitis. Was no hiatal hernia biopsies were also obtained in the midesophagus at 30 cm ( #2) because the squamocolumnar junction was indiscrete and located at 40 cm from the incisors.  Stomach: gastric rugal folds were unremarkable. There was no bile in the stomach. Gastric antrum showed mild nonspecific erythema. Biopsies were obtained to rule out H. pylori. Gastric outlet was normal. Retroflexion of the endoscope revealed normal fundus and cardia  Duodenum: Duodenal bulb and descending duodenum was normal        The scope was then withdrawn from the patient and the procedure completed.  COMPLICATIONS: There were no immediate complications.  ENDOSCOPIC IMPRESSION: 1. symptomatic gastroesophageal reflux without endoscopic evidence of esophagitis. Status post biopsies from GE  junction and from mid esophagus to rule out microscopic esophagitis or eosinophilic esophagitis 2. Minimal antral gastritis. Status post biopsies to rule out H. pylori  RECOMMENDATIONS: 1.  Await pathology results 2.  Anti-reflux regimen to be follow 3. consider adding Reglan 5 mg at bedtime 4. Consider gastric emptying scan to rule out gastroparesis 5 .continue PPIs. Consider switching to AcipHex   REPEAT EXAM: for EGD pending biopsy results.  eSigned:  Lafayette Dragon, MD 07/30/2014 2:35 PM    CC:  PATIENT NAME:  Meagan, Hurley MR#: [id number]

## 2014-07-31 ENCOUNTER — Ambulatory Visit: Payer: Commercial Managed Care - PPO | Admitting: Internal Medicine

## 2014-07-31 ENCOUNTER — Telehealth: Payer: Self-pay

## 2014-07-31 NOTE — Telephone Encounter (Signed)
  Follow up Call-  Call back number 07/30/2014  Post procedure Call Back phone  # cell [phone]  Permission to leave phone message Yes     Patient questions:  Do you have a fever, pain , or abdominal swelling? No. Pain Score  0 *  Have you tolerated food without any problems? Yes.    Have you been able to return to your normal activities? Yes.    Do you have any questions about your discharge instructions: Diet   No. Medications  No. Follow up visit  No.  Do you have questions or concerns about your Care? No.  Actions: * If pain score is 4 or above: No action needed, pain <4.

## 2014-08-07 ENCOUNTER — Encounter: Payer: Self-pay | Admitting: Internal Medicine

## 2014-09-05 ENCOUNTER — Ambulatory Visit: Payer: Commercial Managed Care - PPO | Admitting: Internal Medicine

## 2014-10-21 ENCOUNTER — Other Ambulatory Visit: Payer: Self-pay | Admitting: Physician Assistant

## 2014-12-26 ENCOUNTER — Other Ambulatory Visit: Payer: Self-pay | Admitting: Physician Assistant

## 2014-12-29 ENCOUNTER — Telehealth: Payer: Self-pay | Admitting: Internal Medicine

## 2014-12-29 ENCOUNTER — Other Ambulatory Visit: Payer: Self-pay | Admitting: Physician Assistant

## 2015-01-27 NOTE — Telephone Encounter (Signed)
Done

## 2015-02-27 ENCOUNTER — Telehealth: Payer: Self-pay | Admitting: Internal Medicine

## 2015-02-27 NOTE — Telephone Encounter (Signed)
Left message for pt to call back  °

## 2015-03-04 NOTE — Telephone Encounter (Signed)
Left a message for patient to call back. 

## 2015-03-04 NOTE — Telephone Encounter (Signed)
Patient would like to see Dr. Silverio Decamp as new GI MD. Scheduled OV on 05/14/15. She states she usually has labs done every 3 months for Dr. Olevia Perches. She has them done at her office and would like order faxed there ( previous labs included CBC, Cmet, Sed rate, TSH, B12 and iron studies) Please, advise if you would llike for her to have labs.

## 2015-03-04 NOTE — Telephone Encounter (Signed)
Orders faxed to office number given.

## 2015-03-04 NOTE — Telephone Encounter (Signed)
Ok to do labs listed. Thanks

## 2015-03-11 ENCOUNTER — Other Ambulatory Visit: Payer: Self-pay | Admitting: Gastroenterology

## 2015-03-11 LAB — CBC WITH DIFFERENTIAL/PLATELET
BASOS ABS: 0 10*3/uL (ref 0.0–0.1)
Basophils Relative: 1 % (ref 0–1)
EOS ABS: 0.1 10*3/uL (ref 0.0–0.7)
EOS PCT: 3 % (ref 0–5)
HCT: 37.9 % (ref 36.0–46.0)
Hemoglobin: 12.7 g/dL (ref 12.0–15.0)
Lymphocytes Relative: 27 % (ref 12–46)
Lymphs Abs: 0.7 10*3/uL (ref 0.7–4.0)
MCH: 29.2 pg (ref 26.0–34.0)
MCHC: 33.5 g/dL (ref 30.0–36.0)
MCV: 87.1 fL (ref 78.0–100.0)
MPV: 9.6 fL (ref 8.6–12.4)
Monocytes Absolute: 0.2 10*3/uL (ref 0.1–1.0)
Monocytes Relative: 6 % (ref 3–12)
NEUTROS PCT: 63 % (ref 43–77)
Neutro Abs: 1.7 10*3/uL (ref 1.7–7.7)
PLATELETS: 241 10*3/uL (ref 150–400)
RBC: 4.35 MIL/uL (ref 3.87–5.11)
RDW: 14.5 % (ref 11.5–15.5)
WBC: 2.7 10*3/uL — ABNORMAL LOW (ref 4.0–10.5)

## 2015-03-12 LAB — FERRITIN: FERRITIN: 37 ng/mL (ref 10–291)

## 2015-03-12 LAB — SEDIMENTATION RATE: SED RATE: 7 mm/h (ref 0–20)

## 2015-03-12 LAB — IRON AND TIBC
%SAT: 26 % (ref 11–50)
IRON: 91 ug/dL (ref 40–190)
TIBC: 351 ug/dL (ref 250–450)
UIBC: 260 ug/dL (ref 125–400)

## 2015-03-12 LAB — COMPLETE METABOLIC PANEL WITH GFR
ALBUMIN: 4.3 g/dL (ref 3.6–5.1)
ALK PHOS: 51 U/L (ref 33–115)
ALT: 20 U/L (ref 6–29)
AST: 29 U/L (ref 10–30)
BILIRUBIN TOTAL: 0.4 mg/dL (ref 0.2–1.2)
BUN: 9 mg/dL (ref 7–25)
CALCIUM: 9.3 mg/dL (ref 8.6–10.2)
CO2: 22 mmol/L (ref 20–31)
Chloride: 105 mmol/L (ref 98–110)
Creat: 0.71 mg/dL (ref 0.50–1.10)
Glucose, Bld: 88 mg/dL (ref 65–99)
Potassium: 3.8 mmol/L (ref 3.5–5.3)
SODIUM: 139 mmol/L (ref 135–146)
TOTAL PROTEIN: 7.3 g/dL (ref 6.1–8.1)

## 2015-03-12 LAB — TSH: TSH: 1.891 u[IU]/mL (ref 0.350–4.500)

## 2015-03-12 LAB — VITAMIN B12: Vitamin B-12: 906 pg/mL (ref 211–911)

## 2015-04-22 ENCOUNTER — Telehealth: Payer: Self-pay | Admitting: Gastroenterology

## 2015-04-22 MED ORDER — DEXLANSOPRAZOLE 30 MG PO CPDR: 30.0000 mg | DELAYED_RELEASE_CAPSULE | Freq: Every day | ORAL | Status: DC

## 2015-04-22 NOTE — Telephone Encounter (Signed)
Spoke with the patient. She requests her labs be faxed to [phone]. She would like to see if she can tolerate a decrease in the Dexilant dosage. She would like to try 30 mg instead of 60 mg.  I will send a new prescription.

## 2015-04-23 NOTE — Telephone Encounter (Signed)
Ok with Dexilant 54m and please fax results

## 2015-05-14 ENCOUNTER — Ambulatory Visit (INDEPENDENT_AMBULATORY_CARE_PROVIDER_SITE_OTHER): Payer: Commercial Managed Care - PPO | Admitting: Gastroenterology

## 2015-05-14 ENCOUNTER — Encounter: Payer: Self-pay | Admitting: Gastroenterology

## 2015-05-14 VITALS — BP 102/60 | HR 72 | Ht 66.75 in | Wt 165.0 lb

## 2015-05-14 DIAGNOSIS — K5010 Crohn's disease of large intestine without complications: Secondary | ICD-10-CM | POA: Diagnosis not present

## 2015-05-14 MED ORDER — HYDROCORTISONE ACE-PRAMOXINE 2.5-1 % RE CREA: TOPICAL_CREAM | Freq: Three times a day (TID) | RECTAL | Status: DC

## 2015-05-14 MED ORDER — DEXLANSOPRAZOLE 30 MG PO CPDR: 30.0000 mg | DELAYED_RELEASE_CAPSULE | Freq: Every day | ORAL | Status: DC

## 2015-05-14 MED ORDER — POLYETHYLENE GLYCOL 3350 17 GM/SCOOP PO POWD: ORAL | Status: DC

## 2015-05-14 MED ORDER — RANITIDINE HCL 150 MG PO TABS: 150.0000 mg | ORAL_TABLET | Freq: Two times a day (BID) | ORAL | Status: DC

## 2015-05-14 MED ORDER — MERCAPTOPURINE 50 MG PO TABS: ORAL_TABLET | ORAL | Status: DC

## 2015-05-14 MED ORDER — BALSALAZIDE DISODIUM 750 MG PO CAPS: ORAL_CAPSULE | ORAL | Status: DC

## 2015-05-14 MED ORDER — MESALAMINE 1000 MG RE SUPP: RECTAL | Status: DC

## 2015-05-14 NOTE — Patient Instructions (Signed)
Folow up in 1 year Have your CBC drawn in 69month (printed order given) Please fax result to Dr NSilverio Decampat [phone]

## 2015-05-22 ENCOUNTER — Telehealth: Payer: Self-pay | Admitting: Gastroenterology

## 2015-05-22 NOTE — Telephone Encounter (Signed)
Left message on machine to call back  

## 2015-05-25 NOTE — Telephone Encounter (Signed)
LM for patient to return call.

## 2015-05-26 NOTE — Telephone Encounter (Signed)
Patient returned call, she needed dexliant cancelled due

## 2015-05-26 NOTE — Telephone Encounter (Signed)
Patient wanted pantoprazole 4m sent to pharmacy and 6MP 290m90 day supply I cancelled all other GI meds at pharmacy per patients request   Dexilant no longer covered

## 2015-06-01 NOTE — Progress Notes (Signed)
Meagan Hurley    [id number]    05/25/1970  Primary Care Physician:VELAZQUEZ,GRETCHEN, MD  Referring Physician: Loraine Leriche, MD [place], [place] [address]  Chief complaint:   Crohn's disease  HPI:  45 year old African American female  Previously followed by Dr. Delfin Edis with long history of Crohn's colitis which was initially diagnosed in 1997. Her last colonoscopy was done in  In March 2016 which showed normal mucosa and EGD was unremarkable with no significant pathology other than gastritis.  Prior to that she had a colonoscopy in June of 2011 which showed active colitis from 0-20 cm. She has been on a regimen of 6-MP and Colazal with good control of her disease. She comes in today for yearly followup and refills on her meds. She says she's been doing very well and has not had any recent exacerbations. She currently has no complaints of abdominal pain diarrhea nausea or hematochezia.  Reviewed her recent labs within normal limits except for mild leukopenia.  Outpatient Encounter Prescriptions as of 05/14/2015  Medication Sig  . acetaminophen (TYLENOL) 500 MG tablet Take 500 mg by mouth as needed.    Marland Kitchen aluminum hydroxide-magnesium carbonate (GAVISCON) 95-358 MG/15ML SUSP Take 15 mLs by mouth 2 (two) times daily as needed. Pt takes generic  . balsalazide (COLAZAL) 750 MG capsule TAKE 3 CAPSULES BY MOUTH THREE TIMES A DAY  . calcium carbonate (TUMS EX) 750 MG chewable tablet Chew 2 tablets by mouth daily.  . Calcium-Vitamin D 600-200 MG-UNIT per tablet Take 1 tablet by mouth daily.  . Chaste Tree (VITEX EXTRACT PO) Take 2 capsules by mouth 2 (two) times daily. Takes BID 2 weeks out of the month  . Dexlansoprazole (DEXILANT) 30 MG capsule Take 1 capsule (30 mg total) by mouth daily.  . ferrous sulfate 325 (65 FE) MG tablet Take 325 mg by mouth every Monday, Wednesday, and Friday.   . Fiber CHEW Chew 2 tablets by mouth. .  . Flaxseed, Linseed, (FLAX  SEEDS PO) Take 1-2 scoop by mouth daily.  Marland Kitchen FLUoxetine (PROZAC) 20 MG tablet Take 20 mg by mouth daily.  . fluticasone (FLONASE) 50 MCG/ACT nasal spray Place 1-2 sprays into the nose daily as needed.   . hydrocortisone-pramoxine (ANALPRAM-HC) 2.5-1 % rectal cream Place rectally 3 (three) times daily.  Marland Kitchen loratadine-pseudoephedrine (CLARITIN-D 24-HOUR) 10-240 MG per 24 hr tablet Take 1 tablet by mouth as needed.   . mercaptopurine (PURINETHOL) 50 MG tablet TAKE 1 TABLET BY MOUTH DAILY. GIVE ON AN EMPTY STOMACH 1 HOUR BEFORE OR 2 HOURS AFTER MEALS. CAUTION: CHEMOTHERAPY.  . mesalamine (CANASA) 1000 MG suppository PLACE 1 SUPPOSITORY (1,000 MG TOTAL) RECTALLY AT BEDTIME.  . mometasone (ELOCON) 0.1 % cream Apply 1 application topically 4 (four) times a week. Takes Monday-thursday  . Multiple Vitamin (MULTIVITAMIN) tablet Take 1 tablet by mouth daily.    . OMEGA 3 1200 MG CAPS Take 2,400 mg by mouth daily.  . polyethylene glycol powder (GLYCOLAX/MIRALAX) powder Take 1 capful dissolved in water once daily as needed  . ranitidine (ZANTAC) 150 MG tablet Take 1 tablet (150 mg total) by mouth 2 (two) times daily. Pt takes generic brand  . [DISCONTINUED] balsalazide (COLAZAL) 750 MG capsule TAKE 3 CAPSULES BY MOUTH THREE TIMES A DAY  . [DISCONTINUED] CANASA 1000 MG suppository PLACE 1 SUPPOSITORY (1,000 MG TOTAL) RECTALLY AT BEDTIME.  . [DISCONTINUED] CANASA 1000 MG suppository PLACE 1 SUPPOSITORY (1,000 MG TOTAL)  RECTALLY AT BEDTIME.  . [DISCONTINUED] Dexlansoprazole (DEXILANT) 30 MG capsule Take 1 capsule (30 mg total) by mouth daily.  . [DISCONTINUED] fluorescein-benoxinate (FLURATE) ophthalmic solution 1 drop once.  . [DISCONTINUED] hydrocortisone-pramoxine (ANALPRAM-HC) 2.5-1 % rectal cream Place rectally 3 (three) times daily.  . [DISCONTINUED] ketoconazole (NIZORAL) 2 % shampoo Apply 1 application topically as needed.   . [DISCONTINUED] mercaptopurine (PURINETHOL) 50 MG tablet TAKE 1 TABLET BY MOUTH  DAILY. GIVE ON AN EMPTY STOMACH 1 HOUR BEFORE OR 2 HOURS AFTER MEALS. CAUTION: CHEMOTHERAPY.  . [DISCONTINUED] polyethylene glycol powder (GLYCOLAX/MIRALAX) powder Take 1 capful dissolved in water once daily as needed   . [DISCONTINUED] ranitidine (ZANTAC) 150 MG tablet Take 150 mg by mouth 2 (two) times daily. Pt takes generic brand   No facility-administered encounter medications on file as of 05/14/2015.    Allergies as of 05/14/2015 - Review Complete 07/30/2014  Allergen Reaction Noted  . Ciprofloxacin Nausea And Vomiting 07/20/2012  . Lac bovis Other (See Comments) 06/26/2014    Past Medical History  Diagnosis Date  . GERD (gastroesophageal reflux disease)   . Eczema   . Hyperlipidemia   . Crohn disease ([place])   . Premenstrual tension syndromes   . Uterine polyp   . Shin splint   . Bone spur   . Mitral valve regurgitation   . Lumbar degenerative disc disease   . Allergy     Past Surgical History  Procedure Laterality Date  . Tonsillectomy    . Dilation and curettage of uterus  2009, 2013    Family History  Problem Relation Age of Onset  . Colon cancer Neg Hx   . Diabetes      uncle  . Diabetes      aunts  . Heart disease Paternal Grandfather   . Heart disease Maternal Uncle   . Testicular cancer Father   . Prostate cancer      uncle  . Kidney disease Maternal Grandmother   . Asthma Mother   . Atrial fibrillation Father   . Glaucoma Father   . Esophageal cancer Neg Hx   . Colon polyps Neg Hx     Social History   Social History  . Marital Status: Single    Spouse Name: N/A  . Number of Children: N/A  . Years of Education: N/A   Occupational History  . Doctor - Internal Medicine    Social History Main Topics  . Smoking status: Never Smoker   . Smokeless tobacco: Never Used  . Alcohol Use: No  . Drug Use: No  . Sexual Activity: Not on file   Other Topics Concern  . Not on file   Social History Narrative      Review of systems: Review of  Systems  Constitutional: Negative for fever and chills.  HENT: Negative.   Eyes: Negative for blurred vision.  Respiratory: Negative for cough, shortness of breath and wheezing.   Cardiovascular: Negative for chest pain and palpitations.  Gastrointestinal: as per HPI Genitourinary: Negative for dysuria, urgency, frequency and hematuria.  Musculoskeletal: Negative for myalgias, back pain and joint pain.  Skin: Negative for itching and rash.  Neurological: Negative for dizziness, tremors, focal weakness, seizures and loss of consciousness.  Endo/Heme/Allergies: Negative for environmental allergies.  Psychiatric/Behavioral: Negative for depression, suicidal ideas and hallucinations.  All other systems reviewed and are negative.   Physical Exam: Filed Vitals:   05/14/15 1615  BP: 102/60  Pulse: 72   Gen:      No  acute distress HEENT:  EOMI, sclera anicteric Neck:     No masses; no thyromegaly Lungs:    Clear to auscultation bilaterally; normal respiratory effort CV:         Regular rate and rhythm; no murmurs Abd:      + bowel sounds; soft, non-tender; no palpable masses, no distension Ext:    No edema; adequate peripheral perfusion Skin:      Warm and dry; no rash Neuro: alert and oriented x 3 Psych: normal mood and affect  Data Reviewed:  EGD and colonoscopy March 2016 1. Surgical [P], gastric anturm - SLIGHT CHRONIC GASTRITIS. - WARTHIN-STARRY STAIN NEGATIVE FOR HELICOBACTER PYLORI. - NO DYSPLASIA OR MALIGNANCY. 2. Surgical [P], GE junction - BENIGN GASTROESOPHAGEAL JUNCTION MUCOSA. - PAS STAIN NEGATIVE FOR FUNGUS. - NO INTESTINAL METAPLASIA, DYSPLASIA OR MALIGNANCY. 3. Surgical [P], mid esophagus - UNREMARKABLE SQUAMOUS MUCOSA. - NO FEATURES OF EOSINOPHILIC ESOPHAGITIS (LESS THAN 2 EOSINOPHILS PER HIGH POWER FIELD). 4. Surgical [P], terminal ileum - UNREMARKABLE ILEAL MUCOSA. - NO GRANULOMAS OR ACTIVE INFLAMMATION. 5. Surgical [P], right colon - UNREMARKABLE COLONIC  MUCOSA. - NO MICROSCOPIC COLITIS, ACTIVE INFLAMMATION OR GRANULOMAS. 6. Surgical [P], ascending, polyp - BENIGN LYMPHOID POLYP. - NO ADENOMATOUS CHANGE OR MALIGNANCY IDENTIFIED. 7. Surgical [P], descending - UNREMARKABLE COLONIC MUCOSA. - NO MICROSCOPIC COLITIS, ACTIVE INFLAMMATION OR GRANULOMAS. 8. Surgical [P], recto/sigmoid - UNREMARKABLE COLONIC MUCOSA.    Assessment and Plan/Recommendations:   45 year old female with Crohn's colitis, currently well controlled on 6-MP and  Balsalazide  continue current meds  repeat labs in 6 months and follow-up annually  due for surveillance colonoscopy in 2021  GERD: continue PPI  Follow antireflux measures  K. Denzil Magnuson , MD [phone] Mon-Fri 8a-5p [phone] after 5p, weekends, holidays

## 2015-09-14 ENCOUNTER — Encounter: Payer: Self-pay | Admitting: Internal Medicine

## 2016-01-28 ENCOUNTER — Telehealth: Payer: Self-pay | Admitting: Gastroenterology

## 2016-01-28 MED ORDER — DEXLANSOPRAZOLE 30 MG PO CPDR: 30.0000 mg | DELAYED_RELEASE_CAPSULE | Freq: Every day | ORAL | 2 refills | Status: DC

## 2016-01-28 NOTE — Telephone Encounter (Signed)
Sent dexilant to [employer] [facility]

## 2016-02-05 ENCOUNTER — Telehealth: Payer: Self-pay | Admitting: *Deleted

## 2016-02-05 NOTE — Telephone Encounter (Signed)
Left two messages for patient that insurance does not cover any PPIs except Protonix 40 mg at twice a day. L/M for her to return my call

## 2016-03-24 ENCOUNTER — Telehealth: Payer: Self-pay | Admitting: Gastroenterology

## 2016-03-24 MED ORDER — HYDROCORTISONE ACE-PRAMOXINE 2.5-1 % RE CREA: TOPICAL_CREAM | Freq: Three times a day (TID) | RECTAL | 3 refills | Status: DC

## 2016-03-24 NOTE — Telephone Encounter (Signed)
Med sent to pharmacy.

## 2016-06-08 ENCOUNTER — Other Ambulatory Visit: Payer: Self-pay | Admitting: Gastroenterology

## 2016-06-13 ENCOUNTER — Telehealth: Payer: Self-pay | Admitting: *Deleted

## 2016-06-13 NOTE — Telephone Encounter (Signed)
Called and left message for her to return my call, I need to know if she has tried pantoprazole yet to get this Dexilant approved. Forms filled out to fax to insurance but waiting on this information from patient

## 2016-06-15 ENCOUNTER — Telehealth: Payer: Self-pay | Admitting: Gastroenterology

## 2016-06-15 ENCOUNTER — Other Ambulatory Visit: Payer: Self-pay | Admitting: Gastroenterology

## 2016-06-15 NOTE — Telephone Encounter (Signed)
Faxed over [facility] prior authorization form for Dexilant  Patient has taken preferred medication protonix    Waiting on results

## 2016-06-16 NOTE — Telephone Encounter (Signed)
Still waiting on results

## 2016-06-17 NOTE — Telephone Encounter (Signed)
Dexilant denied because patient needed 80 mg daily of Nexium and Protonix tried and failed before they will cover Dexilant. I have no documentation that she has tried these dosages

## 2016-07-01 MED ORDER — PANTOPRAZOLE SODIUM 40 MG PO TBEC: 40.0000 mg | DELAYED_RELEASE_TABLET | Freq: Two times a day (BID) | ORAL | 3 refills | Status: DC

## 2016-07-01 MED ORDER — PANTOPRAZOLE SODIUM 40 MG PO TBEC: 40.0000 mg | DELAYED_RELEASE_TABLET | Freq: Every day | ORAL | 3 refills | Status: DC

## 2016-07-01 NOTE — Addendum Note (Signed)
Addended by: Oda Kilts on: 07/01/2016 04:23 PM   Modules accepted: Orders

## 2016-07-01 NOTE — Telephone Encounter (Signed)
Patient needs to have tried 80 mg daily of pantoprazole before dexilant is approved  Will send in 59m twice daily

## 2016-07-01 NOTE — Telephone Encounter (Signed)
No call back from patient. I went on and sent in pantoprazole for patient, she must have tried and failed before dexilant is approved

## 2016-07-19 ENCOUNTER — Encounter: Payer: Self-pay | Admitting: *Deleted

## 2016-07-19 ENCOUNTER — Other Ambulatory Visit: Payer: Self-pay | Admitting: Gastroenterology

## 2016-09-01 ENCOUNTER — Other Ambulatory Visit: Payer: Self-pay | Admitting: Gastroenterology

## 2016-09-06 ENCOUNTER — Encounter: Payer: Self-pay | Admitting: Gastroenterology

## 2016-09-06 ENCOUNTER — Ambulatory Visit (INDEPENDENT_AMBULATORY_CARE_PROVIDER_SITE_OTHER): Payer: Commercial Managed Care - PPO | Admitting: Gastroenterology

## 2016-09-06 ENCOUNTER — Encounter (INDEPENDENT_AMBULATORY_CARE_PROVIDER_SITE_OTHER): Payer: Self-pay

## 2016-09-06 VITALS — BP 100/60 | HR 88 | Ht 66.75 in | Wt 161.1 lb

## 2016-09-06 DIAGNOSIS — K5010 Crohn's disease of large intestine without complications: Secondary | ICD-10-CM

## 2016-09-06 DIAGNOSIS — K219 Gastro-esophageal reflux disease without esophagitis: Secondary | ICD-10-CM

## 2016-09-06 MED ORDER — BALSALAZIDE DISODIUM 750 MG PO CAPS
2250.0000 mg | ORAL_CAPSULE | Freq: Three times a day (TID) | ORAL | 6 refills | Status: DC
Start: 2016-09-06 — End: 2017-09-14

## 2016-09-06 MED ORDER — NA SULFATE-K SULFATE-MG SULF 17.5-3.13-1.6 GM/177ML PO SOLN: 1.0000 | Freq: Once | ORAL | 0 refills | Status: AC

## 2016-09-06 MED ORDER — DEXLANSOPRAZOLE 30 MG PO CPDR: 30.0000 mg | DELAYED_RELEASE_CAPSULE | Freq: Every day | ORAL | 3 refills | Status: DC

## 2016-09-06 MED ORDER — MERCAPTOPURINE 50 MG PO TABS: ORAL_TABLET | ORAL | 3 refills | Status: DC

## 2016-09-06 NOTE — Patient Instructions (Signed)
You have been scheduled for a colonoscopy. Please follow written instructions given to you at your visit today.  Please pick up your prep supplies at the pharmacy within the next 1-3 days. If you use inhalers (even only as needed), please bring them with you on the day of your procedure. Your physician has requested that you go to www.startemmi.com and enter the access code given to you at your visit today. This web site gives a general overview about your procedure. However, you should still follow specific instructions given to you by our office regarding your preparation for the procedure.  We have scheduled you for a Bone Density scan at [facility] on 09/16/2016 at 8:30am    Go to the basement today for labs  Follow up in 6 months

## 2016-09-06 NOTE — Progress Notes (Addendum)
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 No specific GI complaints. She is having regular bowel movements and denies any blood per rectum. No dysphagia, odynophagia, nausea, vomiting, abdominal pain or change in bowel habits.  Last colonoscopy March 2016 with normal mucosa and no evidence of active colitis. Prior to that colonoscopy in June 2011 showed active disease with proctitis from 0-20 cm from anal verge.   Patient here today for routine follow-up and med refills.     Outpatient Encounter Prescriptions as of 09/06/2016  Medication Sig  . acetaminophen (TYLENOL) 500 MG tablet Take 500 mg by mouth as needed.    . balsalazide (COLAZAL) 750 MG capsule TAKE 3 CAPSULES BY MOUTH THREE TIMES A DAY  . calcium elemental as carbonate (TUMS ULTRA 1000) 400 MG chewable tablet Chew 1,000 mg by mouth daily.  . Calcium-Vitamin D 600-200 MG-UNIT per tablet Take 1 tablet by mouth daily.  . Chaste Tree (VITEX EXTRACT PO) Take 1 capsule by mouth daily. Takes BID 2 weeks out of the month   . DEXILANT 30 MG capsule TAKE 1 CAPSULE (30 MG TOTAL) BY MOUTH DAILY.  . ferrous sulfate 325 (65 FE) MG tablet Take 325 mg by mouth every Monday, Wednesday, and Friday.   . Fiber CHEW Chew 2 tablets by mouth. .  . Flaxseed, Linseed, (FLAX SEEDS PO) Take 1-2 scoop by mouth daily.  Marland Kitchen FLUoxetine (PROZAC) 20 MG tablet Take 20 mg by mouth daily.  . fluticasone (FLONASE) 50 MCG/ACT nasal spray Place 1-2 sprays into the nose daily as needed.   . loratadine-pseudoephedrine (CLARITIN-D 24-HOUR) 10-240 MG per 24 hr tablet Take 1 tablet by mouth as needed.   . mercaptopurine (PURINETHOL) 50 MG tablet  TAKE 1/2 TABLET (25 MG TOTAL) BY MOUTH EVERY DAY  . Multiple Vitamin (MULTIVITAMIN) tablet Take 1 tablet by mouth daily.    . OMEGA 3 1200 MG CAPS Take 2,400 mg by mouth daily.  . [DISCONTINUED] aluminum hydroxide-magnesium carbonate (GAVISCON) 95-358 MG/15ML SUSP Take 15 mLs by mouth 2 (two) times daily as needed. Pt takes generic  . [DISCONTINUED] calcium carbonate (TUMS EX) 750 MG chewable tablet Chew 2 tablets by mouth daily.  . [DISCONTINUED] Dexlansoprazole (DEXILANT) 30 MG capsule Take 1 capsule (30 mg total) by mouth daily.  . [DISCONTINUED] hydrocortisone-pramoxine (ANALPRAM-HC) 2.5-1 % rectal cream Place rectally 3 (three) times daily.  . [DISCONTINUED] mesalamine (CANASA) 1000 MG suppository PLACE 1 SUPPOSITORY (1,000 MG TOTAL) RECTALLY AT BEDTIME.  . [DISCONTINUED] mometasone (ELOCON) 0.1 % cream Apply 1 application topically 4 (four) times a week. Takes Monday-thursday  . [DISCONTINUED] polyethylene glycol powder (GLYCOLAX/MIRALAX) powder Take 1 capful dissolved in water once daily as needed   No facility-administered encounter medications on file as of 09/06/2016.     Allergies as of 09/06/2016 - Review Complete 09/06/2016  Allergen Reaction Noted  . Ciprofloxacin Nausea And Vomiting 07/20/2012  . Lac bovis Other (See Comments) 06/26/2014    Past Medical History:  Diagnosis Date  . Allergy   . Bone spur   . Crohn disease ([place])   . Eczema   . GERD (gastroesophageal reflux disease)   . Hyperlipidemia   . Lumbar  degenerative disc disease   . Mitral valve regurgitation   . Premenstrual tension syndromes   . Shin splint   . Uterine polyp     Past Surgical History:  Procedure Laterality Date  . DILATION AND CURETTAGE OF UTERUS  2009, 2013  . TONSILLECTOMY      Family History  Problem Relation Age of Onset  . Diabetes      uncle  . Diabetes      aunts  . Heart disease Paternal Grandfather   . Heart disease Maternal Uncle   . Testicular cancer Father   . Atrial  fibrillation Father   . Glaucoma Father   . Prostate cancer      uncle  . Kidney disease Maternal Grandmother   . Asthma Mother   . Colon cancer Neg Hx   . Esophageal cancer Neg Hx   . Colon polyps Neg Hx     Social History   Social History  . Marital status: Single    Spouse name: N/A  . Number of children: N/A  . Years of education: N/A   Occupational History  . Doctor - Internal Medicine    Social History Main Topics  . Smoking status: Never Smoker  . Smokeless tobacco: Never Used  . Alcohol use No  . Drug use: No  . Sexual activity: Not on file   Other Topics Concern  . Not on file   Social History Narrative  . No narrative on file      Review of systems: Review of Systems  Constitutional: Negative for fever and chills.  HENT:Positive for allergic rhinitis and postnasal drip   Eyes: Negative for blurred vision.  Respiratory: Negative for cough, shortness of breath and wheezing.   Cardiovascular: Negative for chest pain and palpitations.  Gastrointestinal: as per HPI Genitourinary: Negative for dysuria, urgency, frequency and hematuria.  Musculoskeletal: Negative for myalgias, back pain and joint pain.  Skin: Positive for itching and rash.  Neurological: Negative for dizziness, tremors, focal weakness, seizures and loss of consciousness.  Endo/Heme/Allergies: Positive for seasonal allergies.  Psychiatric/Behavioral: Negative for depression, suicidal ideas and hallucinations.  All other systems reviewed and are negative.   Physical Exam: Vitals:   09/06/16 1438  BP: 100/60  Pulse: 88   Body mass index is 25.43 kg/m. Gen:      No acute distress HEENT:  EOMI, sclera anicteric Neck:     No masses; no thyromegaly Lungs:    Clear to auscultation bilaterally; normal respiratory effort CV:         Regular rate and rhythm; no murmurs Abd:      + bowel sounds; soft, non-tender; no palpable masses, no distension Ext:    No edema; adequate peripheral  perfusion Skin:      Warm and dry; no rash Neuro: alert and oriented x 3 Psych: normal mood and affect  Data Reviewed:  Reviewed labs, radiology imaging, old records and pertinent past GI work up   Assessment and Plan/Recommendations:  46 year old female with history of Crohn's colitis initially diagnosed in 1997, currently in clinical remission on 6-MP and balsalazide  Crohn's colitis/IBD: In clinical remission Continue 6-MP and balsalazide Last colonoscopy March 2016 Due for surveillance colonoscopy given history of inflammatory bowel disease, will schedule it today The risks and benefits as well as alternatives of endoscopic procedure(s) have been discussed and reviewed. All questions answered. The patient agrees to proceed.  DEXA scan requested  Follow-up CBC, CMP, ferritin, B12 and folate and CRP  GERD: Symptoms stable Continue PPI and antireflux measures  Return in 6 months or sooner if needed  K. Denzil Magnuson , MD [phone] Mon-Fri 8a-5p [phone] after 5p, weekends, holidays  CC: Velazquez, Gretchen Y.,*   Addendum  DEXA scan score -1.1 with increased fracture risk Vitamin D and calcium Reviewed lab results CBC, CMP, B12, folate and CRP within normal limits  K. Denzil Magnuson , MD [phone] Mon-Fri 8a-5p [phone] after 5p, weekends, holidays

## 2016-09-07 ENCOUNTER — Encounter: Payer: Self-pay | Admitting: Gastroenterology

## 2016-09-19 ENCOUNTER — Ambulatory Visit (AMBULATORY_SURGERY_CENTER): Payer: Commercial Managed Care - PPO | Admitting: Gastroenterology

## 2016-09-19 ENCOUNTER — Encounter: Payer: Self-pay | Admitting: Gastroenterology

## 2016-09-19 VITALS — BP 98/58 | HR 60 | Temp 99.1°F | Resp 11 | Ht 66.75 in | Wt 161.0 lb

## 2016-09-19 DIAGNOSIS — K5010 Crohn's disease of large intestine without complications: Secondary | ICD-10-CM

## 2016-09-19 DIAGNOSIS — D122 Benign neoplasm of ascending colon: Secondary | ICD-10-CM

## 2016-09-19 MED ORDER — SODIUM CHLORIDE 0.9 % IV SOLN: 500.0000 mL | INTRAVENOUS | Status: AC

## 2016-09-19 NOTE — Patient Instructions (Signed)
Impression/Recommendations:  Polyp handout given to patient.  Resume previous diet. Continue present medications.  Repeat colonoscopy in 2 years for surveillance.  Return to GI clinic in 6 months.  YOU HAD AN ENDOSCOPIC PROCEDURE TODAY AT [facility]:   Refer to the procedure report that was given to you for any specific questions about what was found during the examination.  If the procedure report does not answer your questions, please call your gastroenterologist to clarify.  If you requested that your care partner not be given the details of your procedure findings, then the procedure report has been included in a sealed envelope for you to review at your convenience later.  YOU SHOULD EXPECT: Some feelings of bloating in the abdomen. Passage of more gas than usual.  Walking can help get rid of the air that was put into your GI tract during the procedure and reduce the bloating. If you had a lower endoscopy (such as a colonoscopy or flexible sigmoidoscopy) you may notice spotting of blood in your stool or on the toilet paper. If you underwent a bowel prep for your procedure, you may not have a normal bowel movement for a few days.  Please Note:  You might notice some irritation and congestion in your nose or some drainage.  This is from the oxygen used during your procedure.  There is no need for concern and it should clear up in a day or so.  SYMPTOMS TO REPORT IMMEDIATELY:   Following lower endoscopy (colonoscopy or flexible sigmoidoscopy):  Excessive amounts of blood in the stool  Significant tenderness or worsening of abdominal pains  Swelling of the abdomen that is new, acute  Fever of 100F or higher  For urgent or emergent issues, a gastroenterologist can be reached at any hour by calling [phone].   DIET:  We do recommend a small meal at first, but then you may proceed to your regular diet.  Drink plenty of fluids but you should avoid alcoholic  beverages for 24 hours.  ACTIVITY:  You should plan to take it easy for the rest of today and you should NOT DRIVE or use heavy machinery until tomorrow (because of the sedation medicines used during the test).    FOLLOW UP: Our staff will call the number listed on your records the next business day following your procedure to check on you and address any questions or concerns that you may have regarding the information given to you following your procedure. If we do not reach you, we will leave a message.  However, if you are feeling well and you are not experiencing any problems, there is no need to return our call.  We will assume that you have returned to your regular daily activities without incident.  If any biopsies were taken you will be contacted by phone or by letter within the next 1-3 weeks.  Please call us at [phone] if you have not heard about the biopsies in 3 weeks.    SIGNATURES/CONFIDENTIALITY: You and/or your care partner have signed paperwork which will be entered into your electronic medical record.  These signatures attest to the fact that that the information above on your After Visit Summary has been reviewed and is understood.  Full responsibility of the confidentiality of this discharge information lies with you and/or your care-partner.

## 2016-09-19 NOTE — Progress Notes (Signed)
To PACU. Report to RN. VSS.

## 2016-09-19 NOTE — Progress Notes (Signed)
Called to room to assist during endoscopic procedure.  Patient ID and intended procedure confirmed with present staff. Received instructions for my participation in the procedure from the performing physician.  

## 2016-09-19 NOTE — Op Note (Signed)
[facility] Patient Name: Meagan Hurley Procedure Date: 09/19/2016 2:06 PM MRN: [id number] Endoscopist: Mauri Pole , MD Age: 46 Referring MD:  Date of Birth: 01-18-1971 Gender: Female Account #: [id number] Procedure:                Colonoscopy Indications:              High risk colon cancer surveillance: Crohn's                            colitis of 8 (or more) years duration Medicines:                Monitored Anesthesia Care Procedure:                Pre-Anesthesia Assessment:                           - Prior to the procedure, a History and Physical                            was performed, and patient medications and                            allergies were reviewed. The patient's tolerance of                            previous anesthesia was also reviewed. The risks                            and benefits of the procedure and the sedation                            options and risks were discussed with the patient.                            All questions were answered, and informed consent                            was obtained. Prior Anticoagulants: The patient has                            taken no previous anticoagulant or antiplatelet                            agents. ASA Grade Assessment: II - A patient with                            mild systemic disease. After reviewing the risks                            and benefits, the patient was deemed in                            satisfactory condition to undergo the procedure.  After obtaining informed consent, the colonoscope                            was passed under direct vision. Throughout the                            procedure, the patient's blood pressure, pulse, and                            oxygen saturations were monitored continuously. The                            Colonoscope was introduced through the anus and                            advanced to the the  terminal ileum, with                            identification of the appendiceal orifice and IC                            valve. The colonoscopy was performed without                            difficulty. The patient tolerated the procedure                            well. The patient tolerated the procedure well. The                            quality of the bowel preparation was excellent. The                            ileocecal valve, appendiceal orifice, and rectum                            were photographed. Scope In: 2:17:45 PM Scope Out: 2:47:54 PM Scope Withdrawal Time: 0 hours 15 minutes 58 seconds  Total Procedure Duration: 0 hours 30 minutes 9 seconds  Findings:                 The perianal and digital rectal examinations were                            normal.                           The Simple Endoscopic Score for Crohn's Disease was                            determined based on the endoscopic appearance of                            the mucosa in the following segments:                           -  Ileum: Findings include no ulcers present, no                            ulcerated surfaces, no affected surfaces and no                            narrowings.                           - Right Colon: Findings include no ulcers present,                            no ulcerated surfaces, no affected surfaces and no                            narrowings.                           - Transverse Colon: Findings include no ulcers                            present, no ulcerated surfaces, no affected                            surfaces and no narrowings.                           - Left Colon: Findings include no ulcers present,                            no ulcerated surfaces, no affected surfaces and no                            narrowings.                           - Rectum: Findings include no ulcers present, no                            ulcerated surfaces, no affected  surfaces and no                            narrowings.                           - Total SES-CD scoring: total aggregate score was                            0. Biopsies were taken with a cold forceps for                            histology.                           A 4 mm polyp was found in the ascending colon. The  polyp was sessile, likely pseudopolyp. The polyp                            was removed with a cold snare. Resection and                            retrieval were complete. Complications:            No immediate complications. Estimated Blood Loss:     Estimated blood loss was minimal. Impression:               - Simple Endoscopic Score for Crohn's Disease:                            total aggregate score was 0, mucosal inflammatory                            changes secondary to Crohn's disease, in remission.                            Biopsied.                           - One 4 mm polyp in the ascending colon, removed                            with a cold snare. Resected and retrieved. Recommendation:           - Patient has a contact number available for                            emergencies. The signs and symptoms of potential                            delayed complications were discussed with the                            patient. Return to normal activities tomorrow.                            Written discharge instructions were provided to the                            patient.                           - Resume previous diet.                           - Continue present medications.                           - Repeat colonoscopy in 2 years for surveillance                            based on pathology results.                           -  Return to GI clinic in 6 months. Mauri Pole, MD 09/19/2016 2:54:33 PM This report has been signed electronically.

## 2016-09-20 ENCOUNTER — Telehealth: Payer: Self-pay

## 2016-09-20 NOTE — Telephone Encounter (Signed)
  Follow up Call-  Call back number 09/19/2016 07/30/2014  Post procedure Call Back phone  # [phone] cell [phone]  Permission to leave phone message Yes Yes  Some recent data might be hidden     Patient questions:  Do you have a fever, pain , or abdominal swelling? No. Pain Score  0 *  Have you tolerated food without any problems? Yes.    Have you been able to return to your normal activities? Yes.    Do you have any questions about your discharge instructions: Diet   No. Medications  No. Follow up visit  No.  Do you have questions or concerns about your Care? No.  Actions: * If pain score is 4 or above: No action needed, pain <4.

## 2016-09-21 ENCOUNTER — Other Ambulatory Visit: Payer: Self-pay | Admitting: Obstetrics and Gynecology

## 2016-09-27 ENCOUNTER — Encounter: Payer: Self-pay | Admitting: Gastroenterology

## 2016-11-16 ENCOUNTER — Encounter (INDEPENDENT_AMBULATORY_CARE_PROVIDER_SITE_OTHER): Payer: Self-pay

## 2016-11-16 ENCOUNTER — Encounter: Payer: Self-pay | Admitting: Nurse Practitioner

## 2016-11-16 ENCOUNTER — Ambulatory Visit (INDEPENDENT_AMBULATORY_CARE_PROVIDER_SITE_OTHER): Payer: Commercial Managed Care - PPO | Admitting: Nurse Practitioner

## 2016-11-16 VITALS — BP 100/60 | HR 72 | Ht 66.75 in | Wt 167.0 lb

## 2016-11-16 DIAGNOSIS — K625 Hemorrhage of anus and rectum: Secondary | ICD-10-CM | POA: Diagnosis not present

## 2016-11-16 DIAGNOSIS — K50119 Crohn's disease of large intestine with unspecified complications: Secondary | ICD-10-CM

## 2016-11-16 MED ORDER — MESALAMINE 1000 MG RE SUPP: 1000.0000 mg | Freq: Every day | RECTAL | 1 refills | Status: DC

## 2016-11-16 NOTE — Progress Notes (Signed)
     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 She has been in remission on colazol and 6 mp.  In January Jadelynn reduced Colazol from TID to BID. She had a colonoscopy in May and there was no active crohn's found but an adenomatous polyp was removed and Cyril Mourning decided it was best to increased Colazol back to TID. After doing so she began to have scant, painless rectal bleeding with otherwise normal BMs. No fevers, arthralgias or other flare type symptoms. .   Past Medical History:  Diagnosis Date  . Allergy   . Anemia 2009   due to dysfunctional uterine bleeding  . Anxiety   . Bone spur   . Crohn disease ([place])   . Depression   . Eczema   . GERD (gastroesophageal reflux disease)   . Heart murmur   . Hyperlipidemia   . Lumbar degenerative disc disease   . Mitral valve regurgitation   . Premenstrual tension syndromes   . Shin splint   . Uterine polyp     Patient's surgical history, family medical history, social history, medications and allergies were all reviewed in Epic    Physical Exam: BP 100/60   Pulse 72   Ht 5' 6.75" (1.695 m)   Wt 167 lb (75.8 kg)   BMI 26.35 kg/m   GENERAL: well developed black female in NAD PSYCH: :Pleasant, cooperative, normal affect CARDIAC:  RRR, no peripheral edema PULM: Normal respiratory effort, lungs CTA bilaterally, no wheezing ABDOMEN:  soft, nontender, nondistended, no obvious masses, no hepatomegaly,  normal bowel sounds RECTAL. No external lesions. 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 She had been in remission without evidence for active disease on recent colonoscopy  with biopsies. Now with several weeks of scant painless rectal bleeding (tissue). On anoscopy she appears to have mild proctitis (had this years ago as well).  -continue Colazol TID, continue 643m-Trial of Canasa Supp Q HS. She will call with update next week.   PaTye Savoy NP 11/16/2016, 9:49 AM

## 2016-11-16 NOTE — Patient Instructions (Signed)
If you are age 47 or older, your body mass index should be between 23-30. Your Body mass index is 26.35 kg/m. If this is out of the aforementioned range listed, please consider follow up with your Primary Care Provider.  If you are age 23 or younger, your body mass index should be between 19-25. Your Body mass index is 26.35 kg/m. If this is out of the aformentioned range listed, please consider follow up with your Primary Care Provider.   We have sent the following medications to your pharmacy for you to pick up at your convenience: Canasa Suppositories  Please call in 7-10 days with an update.  Thank you for choosing me and [facility].   Tye Savoy, NP

## 2016-11-18 NOTE — Progress Notes (Signed)
Reviewed and agree with documentation and assessment and plan. [name] , MD   

## 2016-11-24 ENCOUNTER — Telehealth: Payer: Self-pay | Admitting: Nurse Practitioner

## 2016-11-24 NOTE — Telephone Encounter (Signed)
Meagan Hurley the pt called and states she is doing well and the symptoms have greatly improved.

## 2016-11-28 NOTE — Telephone Encounter (Signed)
Excellent

## 2017-01-16 ENCOUNTER — Other Ambulatory Visit: Payer: Self-pay | Admitting: Nurse Practitioner

## 2017-02-24 ENCOUNTER — Other Ambulatory Visit: Payer: Self-pay | Admitting: *Deleted

## 2017-02-24 ENCOUNTER — Telehealth: Payer: Self-pay | Admitting: *Deleted

## 2017-02-24 MED ORDER — MESALAMINE 1000 MG RE SUPP: RECTAL | 3 refills | Status: DC

## 2017-02-24 NOTE — Telephone Encounter (Signed)
Spoke to Tye Savoy NP, she has given me permission to send # [phone] with 3 refills for the Canasa 1,000 mg suppositories for this patient. [facility] [address], [place], [place].

## 2017-03-01 ENCOUNTER — Telehealth: Payer: Self-pay | Admitting: *Deleted

## 2017-03-01 MED ORDER — DEXLANSOPRAZOLE 30 MG PO CPDR: 30.0000 mg | DELAYED_RELEASE_CAPSULE | Freq: Every day | ORAL | 3 refills | Status: DC

## 2017-03-01 NOTE — Telephone Encounter (Signed)
Dexilant 90 day supply sent to pharmacy per fax request

## 2017-05-12 ENCOUNTER — Encounter: Payer: Self-pay | Admitting: Gastroenterology

## 2017-05-17 ENCOUNTER — Other Ambulatory Visit: Payer: Self-pay | Admitting: Nurse Practitioner

## 2017-07-17 ENCOUNTER — Encounter: Payer: Self-pay | Admitting: Nurse Practitioner

## 2017-07-17 ENCOUNTER — Ambulatory Visit (INDEPENDENT_AMBULATORY_CARE_PROVIDER_SITE_OTHER): Payer: 59 | Admitting: Nurse Practitioner

## 2017-07-17 VITALS — BP 86/60 | HR 88 | Ht 66.75 in | Wt 165.4 lb

## 2017-07-17 DIAGNOSIS — K50119 Crohn's disease of large intestine with unspecified complications: Secondary | ICD-10-CM | POA: Diagnosis not present

## 2017-07-17 DIAGNOSIS — K219 Gastro-esophageal reflux disease without esophagitis: Secondary | ICD-10-CM | POA: Diagnosis not present

## 2017-07-17 NOTE — Patient Instructions (Signed)
If you are age 48 or older, your body mass index should be between 23-30. Your Body mass index is 26.1 kg/m. If this is out of the aforementioned range listed, please consider follow up with your Primary Care Provider.  If you are age 36 or younger, your body mass index should be between 19-25. Your Body mass index is 26.1 kg/m. If this is out of the aformentioned range listed, please consider follow up with your Primary Care Provider.   Use Recticare cream as needed.  Follow up in one year or sooner if needed.  Thank you for choosing me and [facility].   Tye Savoy, NP

## 2017-07-17 NOTE — Progress Notes (Addendum)
IMPRESSION and PLAN:    #1. Crohn's colitis, in clinical remission and in endoscopic remission on last colonoscopy May 2018.  -continue 53m, colazal 6.75 grams / day. She would like to come off canasa supp. She will consider reducing canasa to every other day for a while and if doing okay reduce to once every 3-4 days for several days. If still asymptomatic she will discontinue. If recurrent sx at any time she will resume the previous dose that kept her asymptomatic -follow up in one year or sooner if need be  -Reports normal labs in December but I can't locate in Care Everywhere. I will ask Nurse to track these down since she is on 660m  Addendum: Received labs drawn 04/12/17 WBC low at 3.0 Lymph low at 537 Hgb 13.9 Normal renal function Vitamin D, 25-OH optimal at 42   Leukopenia is chronic and stable. She is taking 6 mp but on a very low dose.     #2.  GERD, asymptomatic on daily Dexilant  #3. ? Anal fissure, right lateral . Minor discomfort after BM. No bleeding. Using steroid cream for what she thought was for perianal fissure treatment. On exam I could not appreciate a fissure. She was sensitive / tender outside of anus in right lateral area but nothing seen. -advised against continuation of steroid cream at this point as nothing obvious to treat. If she did have a fissure would use Diltiazem or NTG ointment anyway.  For minor discomfort I recommended Recticare cream prn.     HPI:    Chief Complaint: follow up on Crohn's colitis.    Patient is a [age]ear old female followed by Dr. NaSilverio Decampor GERD and along-standing history of Crohn's colitis. Last surveillance colonoscopy May 2018.  A small sessile adenomatous polyp without high-grade dysplasia was removed.  Random biopsies unremarkable. Recommended follow up colonoscopy recommended at two year interval.   Is in for routine follow-up.  She is doing well from a Crohn's standpoint.. Bowel movements are normal, no blood.  No  abdominal pain.  She is on 6-MP, Balzide and Canasa suppositories. She is using topical steroid cream for an anal fissure. She gives a hx of an anal fissure and has treated it with topical steroids in the past. No bleeding just some minor discomfort when wiping.   Patient undergoing workup and PT for dizziness. Autoimmune process suspected and course of prednisone did help.   Review of systems:  Positive for dizziness. Sleepiness (time change).      Past Medical History:  Diagnosis Date  . Allergy   . Anemia 2009   due to dysfunctional uterine bleeding  . Anxiety   . Bone spur   . [place]  . Depression   . Eczema   . GERD (gastroesophageal reflux disease)   . Heart murmur   . Hyperlipidemia   . Lumbar degenerative disc disease   . Mitral valve regurgitation   . Premenstrual tension syndromes   . Shin splint   . Uterine polyp     Patient's surgical history, family medical history, social history, medications and allergies were all reviewed in Epic    Physical Exam:     Ht 5' 6.75" (1.695 m)   Wt 165 lb 6 oz (75 kg)   BMI 26.10 kg/m   GENERAL:  Well developed female in NAD PSYCH: :Pleasant, cooperative, normal affect EENT:  conjunctiva pink, mucous membranes moist, neck supple without masses CARDIAC:  RRR, no murmur  heard, no peripheral edema PULM: Normal respiratory effort, lungs CTA bilaterally, no wheezing ABDOMEN:  Nondistended, soft, nontender. No obvious masses, no hepatomegaly,  normal bowel sounds Rectal: external exam negative for hemorrhoids or obvious fissures.  SKIN:  turgor, no lesions seen Musculoskeletal:  Normal muscle tone, normal strength NEURO: Alert and oriented x 3, no focal neurologic deficits   Tye Savoy , NP 07/17/2017, 8:48 AM

## 2017-07-19 NOTE — Progress Notes (Signed)
Reviewed and agree with documentation and assessment and plan. K. Veena Sadeel Fiddler , MD   

## 2017-08-10 ENCOUNTER — Other Ambulatory Visit: Payer: Self-pay

## 2017-08-10 ENCOUNTER — Telehealth: Payer: Self-pay | Admitting: Nurse Practitioner

## 2017-08-10 MED ORDER — MESALAMINE 1000 MG RE SUPP: RECTAL | 3 refills | Status: DC

## 2017-08-10 NOTE — Telephone Encounter (Signed)
Patient states she needs a refill of medication canasa sent to [facility] in [place]. Last seen 07-2017.

## 2017-08-10 NOTE — Telephone Encounter (Signed)
New prescription sent to [facility].

## 2017-09-05 ENCOUNTER — Encounter: Payer: Self-pay | Admitting: Nurse Practitioner

## 2017-09-05 ENCOUNTER — Other Ambulatory Visit: Payer: Self-pay

## 2017-09-05 ENCOUNTER — Telehealth: Payer: Self-pay | Admitting: Nurse Practitioner

## 2017-09-05 DIAGNOSIS — K50119 Crohn's disease of large intestine with unspecified complications: Secondary | ICD-10-CM

## 2017-09-05 NOTE — Telephone Encounter (Signed)
Order for Thiopurine mehtyltransferase (tpmt) and a CBC with diff faxed to [phone]. Spoke to the patient and advised the test requisitions were faxed as requested.

## 2017-09-14 ENCOUNTER — Other Ambulatory Visit: Payer: Self-pay | Admitting: Gastroenterology

## 2017-09-19 ENCOUNTER — Other Ambulatory Visit: Payer: Self-pay | Admitting: Gastroenterology

## 2017-09-25 NOTE — Telephone Encounter (Signed)
Called to [facility]. Lab results will be faxed to [phone].

## 2017-09-25 NOTE — Telephone Encounter (Signed)
Patient wanting to know if results ever came back from labs on 4.30.19 and what to do with her medication now.

## 2017-09-27 ENCOUNTER — Other Ambulatory Visit: Payer: Self-pay

## 2017-09-27 DIAGNOSIS — K50119 Crohn's disease of large intestine with unspecified complications: Secondary | ICD-10-CM

## 2017-09-27 NOTE — Telephone Encounter (Signed)
Patient calling back for lab results best call back # [phone].

## 2017-09-27 NOTE — Telephone Encounter (Signed)
Left information on her voicemail. 

## 2017-09-27 NOTE — Telephone Encounter (Signed)
Ok, so please let her know that her enzyme level was 12 which is just at cutoff for being a low metabolizer. I talked with Dr. Silverio Decamp and she is comfortable leaving her on the same low dose of imuran since it is such a low dose and symptoms controlled. We do want to keep tight check on CBC though to make sure doesn't get more neutropenic. Please gave her get another cbc in 8 weeks. Also, oddly enough, her WBC has historically been low which is why checked the TPMT level but now WBC is normal. Did she stop the imuran herself or was it just coincidentally normal? Thanks

## 2017-10-25 ENCOUNTER — Encounter: Payer: Self-pay | Admitting: Gastroenterology

## 2017-10-25 ENCOUNTER — Telehealth: Payer: Self-pay | Admitting: Gastroenterology

## 2017-10-25 NOTE — Telephone Encounter (Signed)
Prior auth done through cover my meds today at 5:08pm  Waiting on response

## 2017-11-01 ENCOUNTER — Other Ambulatory Visit: Payer: Self-pay | Admitting: Nurse Practitioner

## 2017-11-10 NOTE — Telephone Encounter (Signed)
Per pharmacy, Dexilant went through for $36.10.

## 2018-01-09 ENCOUNTER — Telehealth: Payer: Self-pay | Admitting: Gastroenterology

## 2018-01-09 NOTE — Telephone Encounter (Signed)
Left message on patients voicemail to call office.   

## 2018-01-12 ENCOUNTER — Telehealth: Payer: Self-pay | Admitting: Gastroenterology

## 2018-01-12 MED ORDER — DEXLANSOPRAZOLE 60 MG PO CPDR: 60.0000 mg | DELAYED_RELEASE_CAPSULE | Freq: Every day | ORAL | 3 refills | Status: AC

## 2018-01-12 NOTE — Telephone Encounter (Signed)
Ok to increase to Dexilant 36m daily.

## 2018-01-12 NOTE — Telephone Encounter (Signed)
Dr Silverio Decamp Please advise

## 2018-01-12 NOTE — Telephone Encounter (Signed)
Left message for pt that we sent in 60 mg dexilant to her pharmacy as requested

## 2018-01-12 NOTE — Telephone Encounter (Signed)
See newer phone note on same question

## 2018-03-03 ENCOUNTER — Other Ambulatory Visit: Payer: Self-pay | Admitting: Gastroenterology

## 2018-04-11 ENCOUNTER — Other Ambulatory Visit: Payer: Self-pay | Admitting: Gastroenterology

## 2018-04-19 ENCOUNTER — Other Ambulatory Visit: Payer: Self-pay | Admitting: Gastroenterology

## 2018-08-31 ENCOUNTER — Encounter: Payer: Self-pay | Admitting: Gastroenterology

## 2018-09-28 ENCOUNTER — Telehealth: Payer: Self-pay | Admitting: Gastroenterology

## 2018-09-28 NOTE — Telephone Encounter (Signed)
Pt stated that she will be transferring care to a different GI because [facility] is no longer within network with her insurance.

## 2018-09-28 NOTE — Telephone Encounter (Signed)
Noted pt aware that new GI office can request records from our office
